# Patient Record
Sex: Female | Born: 1980 | Hispanic: No | Marital: Married | Smoking: Never smoker
Health system: Southern US, Community
[De-identification: ages and names within clinical notes are randomized; demographics above are authoritative.]

## PROBLEM LIST (undated history)

## (undated) ENCOUNTER — Inpatient Hospital Stay (HOSPITAL_COMMUNITY): Payer: Self-pay

## (undated) DIAGNOSIS — R112 Nausea with vomiting, unspecified: Secondary | ICD-10-CM

## (undated) DIAGNOSIS — Z9889 Other specified postprocedural states: Secondary | ICD-10-CM

## (undated) HISTORY — PX: LAPAROSCOPY: SHX197

## (undated) HISTORY — PX: BREAST SURGERY: SHX581

## (undated) HISTORY — PX: POLYPECTOMY: SHX149

---
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
## 1999-07-08 ENCOUNTER — Encounter: Payer: Self-pay | Admitting: *Deleted

## 1999-07-08 ENCOUNTER — Inpatient Hospital Stay (HOSPITAL_COMMUNITY): Admission: AD | Admit: 1999-07-08 | Discharge: 1999-07-08 | Payer: Self-pay | Admitting: *Deleted

## 1999-12-23 ENCOUNTER — Other Ambulatory Visit: Admission: RE | Admit: 1999-12-23 | Discharge: 1999-12-23 | Payer: Self-pay | Admitting: Obstetrics and Gynecology

## 2000-03-31 HISTORY — PX: REDUCTION MAMMAPLASTY: SUR839

## 2000-04-16 ENCOUNTER — Inpatient Hospital Stay (HOSPITAL_COMMUNITY): Admission: AD | Admit: 2000-04-16 | Discharge: 2000-04-16 | Payer: Self-pay | Admitting: Obstetrics and Gynecology

## 2000-04-17 ENCOUNTER — Inpatient Hospital Stay (HOSPITAL_COMMUNITY): Admission: AD | Admit: 2000-04-17 | Discharge: 2000-04-19 | Payer: Self-pay | Admitting: Obstetrics and Gynecology

## 2000-04-17 ENCOUNTER — Encounter: Payer: Self-pay | Admitting: Obstetrics and Gynecology

## 2000-05-12 ENCOUNTER — Inpatient Hospital Stay (HOSPITAL_COMMUNITY): Admission: AD | Admit: 2000-05-12 | Discharge: 2000-05-12 | Payer: Self-pay | Admitting: Obstetrics and Gynecology

## 2000-06-10 ENCOUNTER — Inpatient Hospital Stay (HOSPITAL_COMMUNITY): Admission: AD | Admit: 2000-06-10 | Discharge: 2000-06-10 | Payer: Self-pay | Admitting: Obstetrics and Gynecology

## 2000-06-15 ENCOUNTER — Inpatient Hospital Stay (HOSPITAL_COMMUNITY): Admission: AD | Admit: 2000-06-15 | Discharge: 2000-06-15 | Payer: Self-pay | Admitting: Obstetrics and Gynecology

## 2000-06-23 ENCOUNTER — Inpatient Hospital Stay (HOSPITAL_COMMUNITY): Admission: AD | Admit: 2000-06-23 | Discharge: 2000-06-25 | Payer: Self-pay | Admitting: Obstetrics and Gynecology

## 2000-12-22 ENCOUNTER — Other Ambulatory Visit: Admission: RE | Admit: 2000-12-22 | Discharge: 2000-12-22 | Payer: Self-pay | Admitting: Obstetrics and Gynecology

## 2000-12-24 ENCOUNTER — Encounter: Payer: Self-pay | Admitting: Obstetrics and Gynecology

## 2000-12-24 ENCOUNTER — Encounter: Admission: RE | Admit: 2000-12-24 | Discharge: 2000-12-24 | Payer: Self-pay | Admitting: Obstetrics and Gynecology

## 2001-05-27 ENCOUNTER — Inpatient Hospital Stay (HOSPITAL_COMMUNITY): Admission: AD | Admit: 2001-05-27 | Discharge: 2001-05-27 | Payer: Self-pay | Admitting: *Deleted

## 2001-11-04 ENCOUNTER — Encounter (INDEPENDENT_AMBULATORY_CARE_PROVIDER_SITE_OTHER): Payer: Self-pay | Admitting: Specialist

## 2001-11-04 ENCOUNTER — Ambulatory Visit (HOSPITAL_BASED_OUTPATIENT_CLINIC_OR_DEPARTMENT_OTHER): Admission: RE | Admit: 2001-11-04 | Discharge: 2001-11-05 | Payer: Self-pay | Admitting: Plastic Surgery

## 2002-10-05 ENCOUNTER — Other Ambulatory Visit: Admission: RE | Admit: 2002-10-05 | Discharge: 2002-10-05 | Payer: Self-pay | Admitting: Obstetrics and Gynecology

## 2003-04-05 ENCOUNTER — Inpatient Hospital Stay (HOSPITAL_COMMUNITY): Admission: AD | Admit: 2003-04-05 | Discharge: 2003-04-07 | Payer: Self-pay | Admitting: Obstetrics and Gynecology

## 2005-03-06 ENCOUNTER — Emergency Department (HOSPITAL_COMMUNITY): Admission: EM | Admit: 2005-03-06 | Discharge: 2005-03-06 | Payer: Self-pay | Admitting: Emergency Medicine

## 2008-05-02 ENCOUNTER — Encounter: Admission: RE | Admit: 2008-05-02 | Discharge: 2008-05-02 | Payer: Self-pay | Admitting: Internal Medicine

## 2009-02-16 ENCOUNTER — Ambulatory Visit (HOSPITAL_COMMUNITY): Admission: RE | Admit: 2009-02-16 | Discharge: 2009-02-16 | Payer: Self-pay | Admitting: Obstetrics and Gynecology

## 2010-01-08 ENCOUNTER — Encounter: Admission: RE | Admit: 2010-01-08 | Discharge: 2010-01-08 | Payer: Self-pay | Admitting: Internal Medicine

## 2010-07-03 LAB — CBC
MCHC: 33.6 g/dL (ref 30.0–36.0)
MCV: 78.7 fL (ref 78.0–100.0)
RDW: 14 % (ref 11.5–15.5)

## 2010-07-03 LAB — HCG, SERUM, QUALITATIVE: Preg, Serum: NEGATIVE

## 2010-08-16 NOTE — H&P (Signed)
[facility]  Patient:    Joyce King, Joyce King                     MRN: [id number] Adm. Date:  [id number] Attending:  Dierdre Forth Pearline Dictator:   Vance Gather Duplantis, C.N.M.                         History and Physical  BRIEF HISTORY:  Ms. Joyce King is a 30 year old single native American female gravida 4, para 0-0-3-0 at 37 and 6/7ths weeks who presented for evaluation complaining of nausea, vomiting, diarrhea this morning and reoccurrence of uterine contractions every 4 to 5 minutes that she had had previously had last night.  She was seen in maternity admissions last night for the same uterine contractions and they resolved after two doses of subcutaneous terbutaline.  She went home on bed rest last night and awoke this morning with nausea, vomiting and diarrhea and has persisted with nausea, vomiting, and diarrhea throughout the day making it difficult for her to keep down the ibuprofen that she was given for contractions.  She denies any headaches or visual disturbances.  She denies any vaginal bleeding.  She reports positive fetal movement.  Her pregnancy has been followed at [facility] by the Certified Nurse Midwife Service to date and has been at risk for history of greater than 2 abortions and this episode of preterm uterine contractions.  OB/GYN HISTORY:  She is a gravida 4, para 0-0-3-0 who had an elective abdomen in 1997 followed by a miscarriage in 1999 and a miscarriage in March of 2001, all with no complications.  She has had a laparoscopy in 2000, and was noted to have multiple cysts on her ovaries.  GENERAL MEDICAL HISTORY:  She has no known drug allergies.  She reports having had the usual childhood diseases, and she reports having had chlamydia in 1999 and being treated at that time.  She reports a history of asthma and uses an inhaler occasionally and she had seizures as a baby but none since.  Her family history is  significant for a paternal uncle with heart attack and a maternal grandmother with insulin-dependent diabetes and a great maternal grandmother with breast cancer.  GENETIC HISTORY:  Negative.  SOCIAL HISTORY:  The father of the baby is Breeze Berringer.  He is involved and supportive. They are both employed full time.  They deny any illicit drug use, alcohol or smoking with this pregnancy.  PRENATAL LABORATORY DATA:  Her blood type is A+.  Antibody screen is negative. Sickle cell trait is negative.  Syphilis is nonreactive.  Rubella immune. Hepatitis B surface antigen is negative.  HIV is nonreactive.  GC and chlamydia are both negative as her first OB visit.  Pap smear was within normal limits and a 1 hour glucola was 117 and maternal serum alpha fetoprotein was within normal range.  PHYSICAL EXAMINATION:  VITAL SIGNS:  Vitals signs are stable with a pulse of 122.  HEENT:  Grossly within normal limits.  HEART:  Regular rate and rhythm.  CHEST:  Clear.  BREASTS:  Soft and nontender.  ABDOMEN:  Gravid with uterine contractions every 4 to 5 minutes that failed to respond to IV fluid hydration in maternity admissions.  CERVIX:  Her cervix is closed, 40% effaced.  Vertex is minus 1 and ballotable between contractions with a thin lower uterine segment.  Clean catheterization urinalysis shows ketones greater than 80.  Specific gravity is 1025 otherwise negative.  Other labs are pending including GC, chlamydia, PIH labs and group B streptococcus.  ASSESSMENT: 1. Intrauterine pregnancy at 33 and 6/7ths weeks. 2. Preterm uterine contractions with possible cervical change and nausea,    vomiting and diarrhea.  PLAN:  Per consult with Dr. Janine Limbo is to admit to try tocolysing her with Procardia to get a complete OB ultrasound and to follow up on her labs. DD:  04/17/00 TD:  04/17/00 Job: [id number]

## 2010-08-16 NOTE — H&P (Signed)
[facility]  Patient:    Joyce King, Joyce King                     MRN: [id number] Adm. Date:  [id number] Attending:  Shaune Spittle Dictator:   Vance Gather Duplantis, C.N.M.                         History and Physical                                Joyce King is a 30 year old single black female gravida 4, para 0-0-3-0 who presents at 39 weeks and 1 day with spontaneous rupture of the membranes at 5:30 this morning.  She reports uterine contractions every three minutes of increasing intensity since rupture of the membranes.  She reports that the fluid she has seen is clear.  She denies any nausea, vomiting, headaches, or visual disturbances.  She reports positive fetal movement.  Her pregnancy has been followed at [facility] by the certified nurse midwife service and has been at risk for positive group B strep, multiple ABs, and some preterm uterine contractions with this pregnancy.  PAST OBSTETRICAL HISTORY:     Gravida 4, para 0-0-3-0.  Had an elective AB in 1997, miscarriage in 1999, and another miscarriage in 2001.  PAST GYNECOLOGICAL HISTORY:   She also reports a history of a laparoscopy in 2000 and that she has multiple cysts on both ovaries.  ALLERGIES:                    No known drug allergies.  PAST MEDICAL HISTORY:         Usual childhood diseases.  She reports a history of asthma and has used inhalers in the past.  She has had no problems with asthma with this pregnancy.  She reports a history of seizures as a baby, but none since.  FAMILY HISTORY:               Significant for paternal uncle with heart attack, maternal grandmother with insulin-dependent diabetes, and maternal great grandmother with breast cancer.  GENETIC HISTORY:              Negative.  SOCIAL HISTORY:               She is single.  The father of the baby is Joyce King.  He is involved and supportive.  They are both employed full-time. They  deny any illicit drug use, alcohol, or smoking with this pregnancy.  PRENATAL LABORATORIES:        Blood type A+.  Antibody screen is negative. Sickle cell trait is negative.  Syphilis is nonreactive.  Rubella is immune. Hepatitis B surface antigen is negative.  HIV is nonreactive.  GC and chlamydia are both negative.  Pap is within normal limits.  One hour glucola is within normal limits.  Maternal serum alpha fetoprotein is also within normal limits.  Beta strep was positive.  PHYSICAL EXAMINATION  VITAL SIGNS:                  Stable.  She is afebrile.  HEENT:                        Grossly within normal limits.  HEART:  Regular rate and rhythm.  CHEST:                        Clear.  BREASTS:                      Soft and nontender.  ABDOMEN:                      Gravid with uterine contractions every three to four minutes.  Fetal heart rate is reactive and reassuring.  PELVIC:                       Cervix:  3 cm, 90%, vertex -1 with clear fluid noted.  Positive pooling.  Positive nitrazine.  Positive fern.  EXTREMITIES:                  Within normal limits.  ASSESSMENT:                   1. Intrauterine pregnancy at term.                               2. Spontaneous rupture of the membranes with                                  clear fluid at 5:30 this morning.                               3. Early labor.                               4. Positive group B strep.  PLAN:                         Admit to labor and delivery for a consult with Dr. Leonard Schwartz.  Follow routine C.N.M. orders.  Give her penicillin for group B strep prophylaxis. DD:  06/23/00 TD:  06/23/00 Job: [id number]

## 2010-08-16 NOTE — Op Note (Signed)
Joyce King, Joyce King                        ACCOUNT NO.:  [id number]   MEDICAL RECORD NO.:  [id number]                   PATIENT TYPE:  AT   LOCATION:  [facility]                                 FACILITY:  [facility]   PHYSICIAN:  Mary A. Contogiannis, M.D.          DATE OF BIRTH:  [birth year]   DATE OF PROCEDURE:  11/04/2001  DATE OF DISCHARGE:  05/27/2001                                 OPERATIVE REPORT   PREOPERATIVE DIAGNOSIS:  Bilateral macromastia.   POSTOPERATIVE DIAGNOSIS:  Bilateral macromastia.   PROCEDURE:  Bilateral reduction mammoplasty.   ATTENDING SURGEON:  Mary A. Contogiannis, M.D.   ASSISTANT:  Charles A. Clearance Coots, M.D.   ANESTHESIA:  General endotracheal.   ANESTHESIOLOGIST:  Janetta Hora. Gelene Mink, M.D.   ESTIMATED BLOOD LOSS:  225 cc   FLUID REPLACEMENT:  2200 cc crystalloid.   URINE OUTPUT:  700 cc   COMPLICATIONS:  None.   JUSTIFICATION FOR OVERNIGHT STAY:  Progressive pain control along with  ambulation and monitoring of nipples and breast flaps.   INDICATIONS FOR PROCEDURE:  The patient is a 30 year old African-American  female with bilateral macromastia that is currently symptomatic.  She  complains of backaches, headaches, shoulder strap groove marks and pain.  She presents for bilateral reduction mammoplasty.   AMOUNT OF BREAST TISSUE REMOVED:  Right breast 614 g, left breast 596 g.   PROCEDURE:  The patient was marked in the preop holding area in the pattern  of Wise for the future bilateral reduction mammoplasties.  She was then  taken back to the OR and placed on the table in the supine position.  After  adequate general endotracheal anesthesia was obtained, the patient's chest  was prepped with Betadine and alcohol and draped in a sterile fashion.  The  base of the breast was then injected with 1% lidocaine with epinephrine.  After adequate hemostasis was obtained, the procedure was begun.   First, the right breast reduction was performed.  The  nipple was marked at  42 mm nipple marker.  This was then incised and the skin de-epithelialized,  surrounded down to the inframammary crease and inferior pedicle pattern.  Next, the medial, superior and lateral skin flaps were then elevated down to  the chest wall.  The excess fat and glandular tissue were removed from the  inferior pedicle.  The nipple was then examined and found to be pink and  viable.  The wound was irrigated with saline irrigation.  Meticulous  hemostasis was obtained with the Bovie electrocautery.  The inferior pedicle  was centralized using 3-0 Prolene suture.  A #10 JP flat fully fluted drain  was then placed into the wound.  The skin flaps were brought together in an  inverted T-junction with a 2-0 Prolene suture.  The incision was then  stapled for temporary closure.   Attention was then turned to the left breast.  The left nipple was marked  with the 42  mm nipple marker.  This was then incised and skin de-  epithelialized down to the inframammary crease and inferior pedicle pattern.  Next, medial, superior and lateral skin flaps were elevated down to the  chest wall.  The excess fat and glandular tissue were removed from the  inferior pedicle.  The nipple was then examined and found to be pink and  viable.  The wound was irrigated with saline irrigation.  Meticulous  hemostasis was obtained with the Bovie electrocautery.  The inferior pedicle  was centralized using 3-0 Prolene suture.  A #10 JP flat fully fluted drain  was then placed into the wound.  The skin flaps were brought together in an  inverted T-junction with a 2-0 chromic suture.  The incision was stable for  temporary closure.  The breast appeared to have good shape and symmetry.  All the staples were then removed and the incisions closed using 3-0  Monocryl interrupted as well as running dermal sutures followed by 4-0  Monocryl in an intra-articular stitch on the skin.   The patient was then  placed upright and the future location of the nipples  was marked on each breast mound with the 38 mm nipple marker.  She was then  placed in recumbent position.  First, the future nipple areolar complex on  the right breast was incised.  The suture was excised full-thickness.  The  nipple was then examined and found to be pink and viable and brought out  into this aperture and sewn in place using 4-0 Monocryl interrupted sutures  followed by 5-0 Monocryl in an intracuticular stitch on the skin.  In a  likewise fashion, the future nipple areolar complex was incised on the left  breast.  This tissue was excised full-thickness.  The nipple was then  examined and found to be pink and viable and brought out into this aperture  and sewn in place using 4-0 Monocryl interrupted dermal sutures followed by  a 5-0 Monocryl running intracuticular stitch on the skin.  The JP drain was  sewn in place using 3-0 nylon suture.  The incision was dressed with Benzoin  and Steri-Strips and the nipples additionally with Bacitracin ointment and  Adaptic.  4x4s were then placed over the wound, and the patient was placed  in a light postoperative support bra.  There were no complications.  The  patient tolerated the procedure well.  Final needle and sponge counts were  reported to be correct at the end of the case.  The patient was then  extubated and taken to the recovery room in stable condition.  She will  remain overnight in the RCC and progressive pain control along with  ambulation, monitoring of the nipples and breast flaps.  Discharge planned  for the morning.  Amount of breast tissue removed from right breast 614 g,  left breast 596 g.                                               Mary A. Contogiannis, M.D.    MAC/MEDQ  D:  11/04/2001  T:  11/08/2001  Job:  [id number]   cc:   Leonette Most A. Clearance Coots, M.D.

## 2010-08-16 NOTE — Discharge Summary (Signed)
[facility]  Patient:    Joyce King, Joyce King                     MRN: [id number] Adm. Date:  [id number] Disc. Date: [id number] Attending:  Shaune Spittle Dictator:   Mack Guise, C.N.M.                           Discharge Summary  ADMITTING DIAGNOSES:          1. Intrauterine pregnancy at 29-6/7 weeks with                                  nausea, vomiting, and diarrhea.                               2. Preterm uterine contractions.  DISCHARGE DIAGNOSES:          1. Intrauterine pregnancy at 30-1/7 weeks,                                  nausea and vomiting resolved.                               2. Preterm contractions stable.  HISTORY/HOSPITAL COURSE:      [name] is a 30 year old, gravida 4, para 0-0-3-0 at 29-6/7 weeks who presents with nausea, vomiting, and diarrhea and uterine contractions that are becoming stronger. She was admitted for tocolysis with Procardia and was put on magnesium sulfate. Her nausea and vomiting improved with antiemetics, and on her second day, she had no further nausea and vomiting and was able to keep down food and fluid. She was treated for positive Chlamydia with Zithromax one gram p.o. and was given betamethasone.  On April 19, 2000 magnesium sulfate was turned off. The patient continued to be stable with no further contractions. Her cervix remained unchanged and was found to be closed and 50%. Her fetal heart rate was reactive and reassuring, and she was discharged home on strict bed rest with no tocolysis and to call for any preterm labor symptoms. She will keep her next scheduled appointment at the office of [facility]. DD:  04/19/00 TD:  04/19/00 Job: [id number]

## 2010-08-16 NOTE — H&P (Signed)
NAME:  GABBRIELLA, PRESSWOOD                       ACCOUNT NO.:  [id number]   MEDICAL RECORD NO.:  [id number]                   PATIENT TYPE:  INP   LOCATION:  9165                                 FACILITY:  [facility]   PHYSICIAN:  Renaldo Reel. Emilee Hero, C.N.M.             DATE OF BIRTH:  10-Apr-1980   DATE OF ADMISSION:  04/05/2003  DATE OF DISCHARGE:                                HISTORY & PHYSICAL   There was no dictation for this job.                                               Renaldo Reel Emilee Hero, C.N.M.    VLL/MEDQ  D:  04/05/2003  T:  04/05/2003  Job:  [id number]

## 2010-08-16 NOTE — H&P (Signed)
NAME:  Joyce King, Joyce King                       ACCOUNT NO.:  [id number]   MEDICAL RECORD NO.:  [id number]                   PATIENT TYPE:  INP   LOCATION:  9165                                 [facility]ILITY:  [facility]   PHYSICIAN:  Naima A. Dillard, M.D.              DATE OF BIRTH:  1980-08-13   DATE OF ADMISSION:  04/05/2003  DATE OF DISCHARGE:                                HISTORY & PHYSICAL   HISTORY OF PRESENT ILLNESS:  The patient is a 30 year old, gravida 5, para 1-  0-3-1, at 36-2/7 weeks who presents for admission to [facility] with  early labor.  She had been seen at [facility] office earlier today  with cervix 3 cm, 85 to 90%, vertex at -1 to 0 station with uterine  contractions every seven minutes.  She now presents back to [facility] with contractions every three to five minutes.  Cervix was 4+ cm, 80%,  vertex at -1 to 0 station with slight bulging bag of water.  She is  therefore admitted to [facility] for further labor care.   Pregnancy has been remarkable for:  1) History of positive Group B Strep in  previous pregnancy, but negative this pregnancy.  2) One TAB and two SAB's.   PRENATAL LABORATORY DATA:  Blood type is A positive, Rh antibody negative,  VDRL nonreactive, rubella titer positive, hepatitis B surface antigen  negative, sickle cell test negative, GC and Chlamydia cultures were  negative.  Pap was normal.  Glucose Challenge was normal. Hemoglobin upon  entry into practice was 13.2, it was 11.7 at 26 weeks.  Sickle cell testing  was negative.  HIV was negative.  Quadruple screen was normal.  Group B  Strep culture was negative at 36 weeks.  GC and Chlamydia cultures were  negative at 36 weeks.  EDC of May 01, 2003, was established by  ultrasound at six weeks and was in agreement with ultrasound at 18 weeks.   HISTORY OF PRESENT PREGNANCY:  The patient entered care at approximately 10  weeks.  She was treated for BV at that time.   She was having some difficulty  with nausea and vomiting and was treated with Phenergan.  She had quadruple  screen that was normal.  She had an ultrasound at 18 weeks that showed  normal growth and development.  She got married on December 10, 2002.  She  had some external itching noted at 26 weeks and was treated with BV again  and Mycolog cream.  She required some dental work at 31 weeks and was placed  on antibiotics.  The rest of her pregnancy was essentially uncomplicated.   PAST OBSTETRICAL HISTORY:  In 1997, the patient had a six-week termination  of pregnancy without complications.  In 1999 and 2001, the patient had first  trimester spontaneous miscarriages.  In 2002, she had a vaginal birth of a  female, weight  7 pounds 2 ounces at [redacted] weeks gestation. She was in labor 12  hours.  She had epidural anesthesia.  She did have preterm cervical  effacement and had positive Beta Strep. This pregnancy is with the same  partner.   PAST MEDICAL HISTORY:  She was on Depo-Provera in the past.  She had a  history of ovarian cyst and a diagnostic laparoscopy.  In 1999 she was  treated for Chlamydia.  She reports occasional yeast infections and the  usual childhood illnesses.  She has occasional asthma attacks very rarely.  She does have a history of a breast reduction.   PAST SURGICAL HISTORY:  Her only other hospitalization was for childbirth.  She had seizures as a baby, but none since.   FAMILY HISTORY:  Her maternal great grandmother had breast cancer, maternal  grandmother had insulin-dependent diabetes mellitus.  Genetic history is  unremarkable.   SOCIAL HISTORY:  The patient is married to the father of the baby.  He is  involved and supportive.  His name is [employer].  The patient is [place]  American and Native American in ethnicity.  She has 1-1/2 years of college.  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  The  patient's husband has four years of  college and he is employed in Airline pilot.  She has been followed by the certified nurse midwife service at [facility].  She denies any alcohol, drug, or tobacco use during this  pregnancy.   PHYSICAL EXAMINATION:  VITAL SIGNS:  Stable, the patient is afebrile.  HEENT:  Within normal limits.  LUNGS:  Bilateral breath sounds are clear.  HEART:  Regular rate and rhythm without murmur.  BREASTS:  Soft and nontender.  ABDOMEN:  Fundal height is approximately 36 cm.  Estimated fetal weight is 6  pounds.  Uterine contractions are every three to five minutes of mild to  moderate quality.  PELVIC:  Cervix 4 to 5 cm, 80%, vertex at -1 to 0 station.  Slight bulging  bag of water noted.  Fetal heart rate is in the 150's by Doppler with no  audible decelerations noted.  EXTREMITIES:  Deep tendon reflexes were 2+ without clonus.  There was a  trace edema noted.   IMPRESSION:  1. Intrauterine pregnancy at 36-2/7 weeks.  2. Early labor.  3. Negative Group B Strep.   PLAN:  Admit to birthing suite per consult with Naima A. Normand Sloop, M.D. as  attending physician.  Routine certified nurse midwife orders.     Renaldo Reel Emilee Hero, C.N.M.                   Naima A. Normand Sloop, M.D.    Leeanne Mannan  D:  04/05/2003  T:  04/05/2003  Job:  [id number]

## 2010-12-25 LAB — HEPATITIS B SURFACE ANTIGEN: Hepatitis B Surface Ag: NEGATIVE

## 2010-12-25 LAB — RUBELLA ANTIBODY, IGM: Rubella: IMMUNE

## 2010-12-25 LAB — ABO/RH

## 2010-12-25 LAB — GC/CHLAMYDIA PROBE AMP, GENITAL: Chlamydia: NEGATIVE

## 2011-04-28 ENCOUNTER — Inpatient Hospital Stay (HOSPITAL_COMMUNITY)
Admission: AD | Admit: 2011-04-28 | Discharge: 2011-04-28 | Disposition: A | Discharge status: Home / Self Care | Payer: Self-pay | Source: Ambulatory Visit | Attending: Obstetrics and Gynecology | Admitting: Obstetrics and Gynecology

## 2011-04-28 ENCOUNTER — Encounter (HOSPITAL_COMMUNITY): Payer: Self-pay

## 2011-04-28 DIAGNOSIS — O4700 False labor before 37 completed weeks of gestation, unspecified trimester: Secondary | ICD-10-CM | POA: Insufficient documentation

## 2011-04-28 DIAGNOSIS — O479 False labor, unspecified: Secondary | ICD-10-CM

## 2011-04-28 MED ORDER — TERBUTALINE SULFATE 1 MG/ML IJ SOLN
INTRAMUSCULAR | Status: AC
  Administered 2011-04-28: 0.25 mg via SUBCUTANEOUS
  Filled 2011-04-28: qty 1

## 2011-04-28 MED ORDER — TERBUTALINE SULFATE 1 MG/ML IJ SOLN
0.2500 mg | Freq: Once | INTRAMUSCULAR | Status: AC
  Administered 2011-04-28: 0.25 mg via SUBCUTANEOUS

## 2011-04-28 NOTE — Progress Notes (Signed)
Patient states she was sent from the office to evaluate preterm contractions. Cervix was long and closed, contractions every 2-3 minutes. Denies any leaking or bleeding and reports good fetal movement.

## 2011-04-28 NOTE — ED Provider Notes (Signed)
History     Chief Complaint  Patient presents with  . Contractions   HPI Ms. Engen is a [id number] who presents today at [redacted]w[redacted]d gestation with a chief complaint of contractions. She has a previous preterm birth at 78 weeks.  She was on bedrest for preterm contractions with her first child, but delivered at 39 weeks She states that she had back pain this weekend and that she began noticing regular contractions about 7:30 am.  She denies any leakage of fluids or loss of mucous plug.  She has been drinking plenty of water.  She saw her Physician Dr. Richardean Chimera this morning just before noon. He performed a fetal fibronectin and stated that her cervix was long and closed at the exam.  Arthor Captain PA-S  OB History    Grav Para Term Preterm Abortions TAB SAB Ect Mult Living   5 2 1 1 2 2   0 0 2      Past Medical History  Diagnosis Date  . Asthma     Past Surgical History  Procedure Date  . Laparoscopy   . Breast surgery     reduction  . Polypectomy     Family History  Problem Relation Age of Onset  . Cancer Mother   . Cancer Maternal Grandmother     History  Substance Use Topics  . Smoking status: Never Smoker   . Smokeless tobacco: Not on file  . Alcohol Use: No    Allergies: No Known Allergies  Prescriptions prior to admission  Medication Sig Dispense Refill  . albuterol (PROVENTIL HFA;VENTOLIN HFA) 108 (90 BASE) MCG/ACT inhaler Inhale 2 puffs into the lungs every 6 (six) hours as needed. For shortness of breath/asthma      . Prenatal Vit-Fe Fumarate-FA (PRENATAL MULTIVITAMIN) TABS Take 1 tablet by mouth at bedtime.        Review of Systems  All other systems reviewed and are negative.   Physical Exam   Blood pressure 125/70, pulse 84, temperature 98.4 F (36.9 C), temperature source Oral, resp. rate 16, SpO2 99.00%.  Physical Exam  Constitutional: She is oriented to person, place, and time. She appears well-developed and well-nourished.  HENT:  Head:  Normocephalic and atraumatic.  Cardiovascular: Normal rate and regular rhythm.   Respiratory: Effort normal and breath sounds normal.  GI: Soft. Bowel sounds are normal.  Genitourinary: Vagina normal.       Cervix closed/ long/ high and soft. External os 1 cm  Neurological: She is alert and oriented to person, place, and time.    MAU Course  Procedures Terbutaline 0.25 mg SQ Cervical exam FHR monitoring  MDM Fetal heart rate  140 with Accels, no decels, moderate variability Contractions 3/10 minutes lasting 40-70 seconds upon arrival, 1 contraction in 45 minutes following administration of terbutaline single SQ dose Discuss plan to d/c home with PTL precautions and f/u in office tomorrow with Dr Arelia Sneddon and he agrees.  Assessment and Plan  A: Preterm contractions   P: Discharge home with PTL precautions Follow up with Dr. Arelia Sneddon tomorrow  LEFTWICH-KIRBY, LISA 04/28/2011, 2:11 PM

## 2011-04-28 NOTE — Progress Notes (Signed)
Pt sent over from office for preterm labor eval.  Reports ucs 2-3 minutes in office.   States ucs started around 0730, is feeling an occasional contractions, but not every 2-3 minutes.  Denies any bleeding or lof.  Was closed in office today.  + FM.

## 2011-06-30 LAB — STREP B DNA PROBE: GBS: POSITIVE

## 2011-07-01 ENCOUNTER — Observation Stay (HOSPITAL_COMMUNITY)
Admission: AD | Admit: 2011-07-01 | Discharge: 2011-07-02 | DRG: 379 | Disposition: A | Discharge status: Home / Self Care | Payer: BC Managed Care – PPO | Source: Ambulatory Visit | Attending: Obstetrics and Gynecology | Admitting: Obstetrics and Gynecology

## 2011-07-01 ENCOUNTER — Encounter (HOSPITAL_COMMUNITY): Payer: Self-pay | Admitting: *Deleted

## 2011-07-01 DIAGNOSIS — O479 False labor, unspecified: Secondary | ICD-10-CM

## 2011-07-01 DIAGNOSIS — O4700 False labor before 37 completed weeks of gestation, unspecified trimester: Principal | ICD-10-CM | POA: Diagnosis present

## 2011-07-01 HISTORY — DX: Nausea with vomiting, unspecified: R11.2

## 2011-07-01 HISTORY — DX: Nausea with vomiting, unspecified: Z98.890

## 2011-07-01 LAB — URINALYSIS, ROUTINE W REFLEX MICROSCOPIC
Bilirubin Urine: NEGATIVE
Glucose, UA: NEGATIVE mg/dL
Ketones, ur: 15 mg/dL — AB
Leukocytes, UA: NEGATIVE
Nitrite: NEGATIVE
Protein, ur: NEGATIVE mg/dL

## 2011-07-01 NOTE — MAU Note (Signed)
PT SAYS SHE HAS BEEN HAVING UC SINCE YESTERDAY.  WENT TO DR YESTERDAY- VE 2 CM       DENIES HSV AND MRSA.

## 2011-07-01 NOTE — MAU Provider Note (Signed)
  History     CSN: [id number]  Arrival date and time: 07/01/11 2112   First Provider Initiated Contact with Patient 07/01/11 2208      No chief complaint on file.  HPI 31 y.o. [id number] at [redacted]w[redacted]d with contractions ongoing since yesterday, increased since 6 PM tonight. No vaginal bleeding or LOF. 2-3 cm in office yesterday per Dr. Renaldo Fiddler.  Pregnancy uncomplicated except preterm labor.    Past Medical History  Diagnosis Date  . Asthma     Past Surgical History  Procedure Date  . Laparoscopy   . Breast surgery     reduction  . Polypectomy     Family History  Problem Relation Age of Onset  . Cancer Mother   . Cancer Maternal Grandmother     History  Substance Use Topics  . Smoking status: Never Smoker   . Smokeless tobacco: Not on file  . Alcohol Use: No    Allergies: No Known Allergies  Prescriptions prior to admission  Medication Sig Dispense Refill  . albuterol (PROVENTIL HFA;VENTOLIN HFA) 108 (90 BASE) MCG/ACT inhaler Inhale 2 puffs into the lungs every 6 (six) hours as needed. For shortness of breath/asthma      . Prenatal Vit-Fe Fumarate-FA (PRENATAL MULTIVITAMIN) TABS Take 1 tablet by mouth at bedtime.      . progesterone (PROMETRIUM) 100 MG capsule Place 100 mg vaginally daily.        Review of Systems  Constitutional: Negative.   Respiratory: Negative.   Cardiovascular: Negative.   Gastrointestinal: Negative for nausea, vomiting, abdominal pain, diarrhea and constipation.  Genitourinary: Negative for dysuria, urgency, frequency, hematuria and flank pain.       Negative for vaginal bleeding, vaginal discharge, + contractions   Musculoskeletal: Negative.   Neurological: Negative.   Psychiatric/Behavioral: Negative.    Physical Exam   Blood pressure 103/64, pulse 78, temperature 97.4 F (36.3 C), temperature source Oral, resp. rate 20, height 5\' 5"  (1.651 m), weight 199 lb 2 oz (90.323 kg).  Physical Exam  Nursing note and vitals  reviewed. Constitutional: She is oriented to person, place, and time. She appears well-developed and well-nourished. No distress.  Cardiovascular: Normal rate.   GI: Soft. There is no tenderness.  Genitourinary:       SVE: 3/70/-2 per RN  Musculoskeletal: Normal range of motion.  Neurological: She is alert and oriented to person, place, and time.  Skin: Skin is warm and dry.  Psychiatric: She has a normal mood and affect.   EFM: 140s, mod variability, + accels, UC q2-3 minutes MAU Course  Procedures  Dr. Renaldo Fiddler informed of cervical exam and contractions, plan to observe for 2 hours then recheck cervix.   After 2 hours, little cervical change, 3-4 cm now per RN, still contracting regularly, will observe for 1 more hour then recheck.   On 3rd exam, 4/70/-2, contractions q 2-3 minutes, pt states becoming more uncomfortable.   Assessment and Plan  31 y.o. [id number] at [redacted]w[redacted]d Preterm contractions with small cervical change - admit overnight for observation per Dr. Cyndee Brightly 07/01/2011, 10:13 PM

## 2011-07-01 NOTE — MAU Note (Signed)
SAYS HAS NOT FELT BABY MOVE AS MUCH AS USUAL.  LAST TIME WAS 2 HRS AGO AFTER EATING

## 2011-07-02 ENCOUNTER — Encounter (HOSPITAL_COMMUNITY): Payer: Self-pay | Admitting: *Deleted

## 2011-07-02 LAB — CBC
HCT: 35.9 % — ABNORMAL LOW (ref 36.0–46.0)
Hemoglobin: 12.7 g/dL (ref 12.0–15.0)
MCH: 26.3 pg (ref 26.0–34.0)
MCHC: 35.4 g/dL (ref 30.0–36.0)
RDW: 14.3 % (ref 11.5–15.5)

## 2011-07-02 LAB — RPR: RPR Ser Ql: NONREACTIVE

## 2011-07-02 MED ORDER — BUTORPHANOL TARTRATE 2 MG/ML IJ SOLN
1.0000 mg | INTRAMUSCULAR | Status: DC | PRN
  Administered 2011-07-02: 1 mg via INTRAVENOUS
  Filled 2011-07-02: qty 1

## 2011-07-02 MED ORDER — PHENYLEPHRINE 40 MCG/ML (10ML) SYRINGE FOR IV PUSH (FOR BLOOD PRESSURE SUPPORT): 80.0000 ug | PREFILLED_SYRINGE | INTRAVENOUS | Status: DC | PRN

## 2011-07-02 MED ORDER — OXYTOCIN 20 UNITS IN LACTATED RINGERS INFUSION - SIMPLE: 125.0000 mL/h | Freq: Once | INTRAVENOUS | Status: DC

## 2011-07-02 MED ORDER — PENICILLIN G POTASSIUM 5000000 UNITS IJ SOLR
5.0000 10*6.[IU] | Freq: Once | INTRAVENOUS | Status: AC
  Administered 2011-07-02: 5 10*6.[IU] via INTRAVENOUS
  Filled 2011-07-02: qty 5

## 2011-07-02 MED ORDER — FENTANYL 2.5 MCG/ML BUPIVACAINE 1/10 % EPIDURAL INFUSION (WH - ANES): 14.0000 mL/h | INTRAMUSCULAR | Status: DC

## 2011-07-02 MED ORDER — OXYCODONE-ACETAMINOPHEN 5-325 MG PO TABS: 1.0000 | ORAL_TABLET | ORAL | Status: DC | PRN

## 2011-07-02 MED ORDER — FLEET ENEMA 7-19 GM/118ML RE ENEM: 1.0000 | ENEMA | RECTAL | Status: DC | PRN

## 2011-07-02 MED ORDER — EPHEDRINE 5 MG/ML INJ: 10.0000 mg | INTRAVENOUS | Status: DC | PRN

## 2011-07-02 MED ORDER — PENICILLIN G POTASSIUM 5000000 UNITS IJ SOLR
2.5000 10*6.[IU] | INTRAVENOUS | Status: DC
  Administered 2011-07-02: 2.5 10*6.[IU] via INTRAVENOUS
  Filled 2011-07-02 (×3): qty 2.5

## 2011-07-02 MED ORDER — LACTATED RINGERS IV SOLN: 500.0000 mL | INTRAVENOUS | Status: DC | PRN

## 2011-07-02 MED ORDER — DIPHENHYDRAMINE HCL 50 MG/ML IJ SOLN: 12.5000 mg | INTRAMUSCULAR | Status: DC | PRN

## 2011-07-02 MED ORDER — IBUPROFEN 600 MG PO TABS: 600.0000 mg | ORAL_TABLET | Freq: Four times a day (QID) | ORAL | Status: DC | PRN

## 2011-07-02 MED ORDER — LACTATED RINGERS IV SOLN: 500.0000 mL | Freq: Once | INTRAVENOUS | Status: DC

## 2011-07-02 MED ORDER — LACTATED RINGERS IV SOLN
INTRAVENOUS | Status: DC
  Administered 2011-07-02: 125 mL/h via INTRAVENOUS

## 2011-07-02 MED ORDER — ACETAMINOPHEN 325 MG PO TABS: 650.0000 mg | ORAL_TABLET | ORAL | Status: DC | PRN

## 2011-07-02 MED ORDER — OXYTOCIN BOLUS FROM INFUSION
500.0000 mL | Freq: Once | INTRAVENOUS | Status: DC
  Filled 2011-07-02: qty 500

## 2011-07-02 MED ORDER — ONDANSETRON HCL 4 MG/2ML IJ SOLN: 4.0000 mg | Freq: Four times a day (QID) | INTRAMUSCULAR | Status: DC | PRN

## 2011-07-02 MED ORDER — CITRIC ACID-SODIUM CITRATE 334-500 MG/5ML PO SOLN: 30.0000 mL | ORAL | Status: DC | PRN

## 2011-07-02 MED ORDER — LIDOCAINE HCL (PF) 1 % IJ SOLN: 30.0000 mL | INTRAMUSCULAR | Status: DC | PRN

## 2011-07-02 NOTE — Discharge Summary (Signed)
Obstetric Discharge Summary Reason for Admission: observation/evaluation Prenatal Procedures: none Intrapartum Procedures: false labor Postpartum Procedures: none Complications-Operative and Postpartum: none Hemoglobin  Date Value Range Status  07/02/2011 12.7  12.0-15.0 (g/dL) Final     HCT  Date Value Range Status  07/02/2011 35.9* 36.0-46.0 (%) Final    Physical Exam:  General: cx no change 3-4 witn 50% effacement fhr reactive no decel  Mild irreg ctx'King   Discharge Diagnoses: false labor  Discharge Information: Date: 07/02/2011 Activity: unrestricted Diet: routine Medications: PNV Condition: stable Instructions: refer to practice specific booklet Discharge to: home   Newborn Data  n/a   Joyce King 07/02/2011, 7:55 AM

## 2011-07-02 NOTE — H&P (Signed)
Joyce King is a 31 y.o. female presenting possible labor at 35+ pnc uncomplicated Maternal Medical History:  Contractions: Onset was less than 1 hour ago.   Frequency: irregular.   Perceived severity is mild.    Fetal activity: Perceived fetal activity is normal.    Prenatal Complications - Diabetes: none.    OB History    Grav Para Term Preterm Abortions TAB SAB Ect Mult Living   5 2 1 1 2 2   0 0 2     Past Medical History  Diagnosis Date  . Asthma   . PONV (postoperative nausea and vomiting)    Past Surgical History  Procedure Date  . Laparoscopy   . Breast surgery     reduction  . Polypectomy    Family History: family history includes Cancer in her maternal grandmother and mother. Social History:  reports that she has never smoked. She does not have any smokeless tobacco history on file. She reports that she does not drink alcohol or use illicit drugs.  ROS  Dilation: 3 Effacement (%): 50 Station: -2 Exam by:: Ramsay Bognar Blood pressure 116/60, pulse 67, temperature 97.7 F (36.5 C), temperature source Oral, resp. rate 18, height 5\' 5"  (1.651 m), weight 90.323 kg (199 lb 2 oz). Maternal Exam:  Uterine Assessment: Contraction strength is mild.  Contraction frequency is irregular.   Abdomen: Patient reports no abdominal tenderness. Fundal height is 35 cm.   Estimated fetal weight is 6.    Cervix: Cervix evaluated by digital exam.     Physical Exam  Prenatal labs: ABO, Rh: A/Positive/-- (09/26 0000) Antibody: Negative (09/26 0000) Rubella: Immune (09/26 0000) RPR: Nonreactive (09/26 0000)  HBsAg: Negative (09/26 0000)  HIV: Non-reactive (09/26 0000)  GBS:     Assessment/Plan: IUP at 35 obsever for labor   Rhylen Pulido S 07/02/2011, 7:59 AM

## 2011-07-02 NOTE — Progress Notes (Signed)
Patient ID: Joyce King, female   DOB: 1980/09/17, 31 y.o.   MRN: [id number] Irreg mild ctx's cx 3-4 50 % posterior no change fhr reactive no decels False labor  D/c home

## 2011-07-02 NOTE — Discharge Instructions (Signed)
Preterm Labor Preterm labor is when labor starts at less than 37 weeks of pregnancy. The normal length of a pregnancy is 39 to 41 weeks. CAUSES Often, there is no identifiable underlying cause as to why a woman goes into preterm labor. However, one of the most common known causes of preterm labor is infection. Infections of the uterus, cervix, vagina, amniotic sac, bladder, kidney, or even the lungs (pneumonia) can cause labor to start. Other causes of preterm labor include:  Urogenital infections, such as yeast infections and bacterial vaginosis.   Uterine abnormalities (uterine shape, uterine septum, fibroids, bleeding from the placenta).   A cervix that has been operated on and opens prematurely.   Malformations in the baby.   Multiple gestations (twins, triplets, and so on).   Breakage of the amniotic sac.  Additional risk factors for preterm labor include:  Previous history of preterm labor.   Premature rupture of membranes (PROM).   A placenta that covers the opening of the cervix (placenta previa).   A placenta that separates from the uterus (placenta abruption).   A cervix that is too weak to hold the baby in the uterus (incompetence cervix).   Having too much fluid in the amniotic sac (polyhydramnios).   Taking illegal drugs or smoking while pregnant.   Not gaining enough weight while pregnant.   Women younger than 7 and older than 32 years old.   Low socioeconomic status.   African-American ethnicity.  SYMPTOMS Signs and symptoms of preterm labor include:  Menstrual-like cramps.   Contractions that are 30 to 70 seconds apart, become very regular, closer together, and are more intense and painful.   Contractions that start on the top of the uterus and spread down to the lower abdomen and back.   A sense of increased pelvic pressure or back pain.   A watery or bloody discharge that comes from the vagina.  DIAGNOSIS  A diagnosis can be confirmed by:  A  vaginal exam.   An ultrasound of the cervix.   Sampling (swabbing) cervico-vaginal secretions. These samples can be tested for the presence of fetal fibronectin. This is a protein found in cervical discharge which is associated with preterm labor.   Fetal monitoring.  TREATMENT  Depending on the length of the pregnancy and other circumstances, a caregiver may suggest bed rest. If necessary, there are medicines that can be given to stop contractions and to quicken fetal lung maturity. If labor happens before 34 weeks of pregnancy, a prolonged hospital stay may be recommended. Treatment depends on the condition of both the mother and baby. PREVENTION There are some things a mother can do to lower the risk of preterm labor in future pregnancies. A woman can:   Stop smoking.   Maintain healthy weight gain and avoid chemicals and drugs that are not necessary.   Be watchful for any type of infection.   Inform her caregiver if she has a known history of preterm labor.  Document Released: 06/07/2003 Document Revised: 03/06/2011 Document Reviewed: 07/12/2010 [facility] Patient Information 2012 [place], [place].Fetal Movement Counts Patient Name: ___[name]_______________________________________________ Patient Due Date: ____05/05/2013________________ Kick counts is highly recommended in high risk pregnancies, but it is a good idea for every pregnant woman to do. Start counting fetal movements at 28 weeks of the pregnancy. Fetal movements increase after eating a full meal or eating or drinking something sweet (the blood sugar is higher). It is also important to drink plenty of fluids (well hydrated) before doing the count.  Lie on your left side because it helps with the circulation or you can sit in a comfortable chair with your arms over your belly (abdomen) with no distractions around you. DOING THE COUNT  Try to do the count the same time of day each time you do it.   Mark the day and time,  then see how long it takes for you to feel 10 movements (kicks, flutters, swishes, rolls). You should have at least 10 movements within 2 hours. You will most likely feel 10 movements in much less than 2 hours. If you do not, wait an hour and count again. After a couple of days you will see a pattern.   What you are looking for is a change in the pattern or not enough counts in 2 hours. Is it taking longer in time to reach 10 movements?  SEEK MEDICAL CARE IF:  You feel less than 10 counts in 2 hours. Tried twice.   No movement in one hour.   The pattern is changing or taking longer each day to reach 10 counts in 2 hours.   You feel the baby is not moving as it usually does.  Date: ____________ Movements: ____________ Start time: ____________ Doreatha Martin time: ____________  Date: ____________ Movements: ____________ Start time: ____________ Doreatha Martin time: ____________ Date: ____________ Movements: ____________ Start time: ____________ Doreatha Martin time: ____________ Date: ____________ Movements: ____________ Start time: ____________ Doreatha Martin time: ____________ Date: ____________ Movements: ____________ Start time: ____________ Doreatha Martin time: ____________ Date: ____________ Movements: ____________ Start time: ____________ Doreatha Martin time: ____________ Date: ____________ Movements: ____________ Start time: ____________ Doreatha Martin time: ____________ Date: ____________ Movements: ____________ Start time: ____________ Doreatha Martin time: ____________  Date: ____________ Movements: ____________ Start time: ____________ Doreatha Martin time: ____________ Date: ____________ Movements: ____________ Start time: ____________ Doreatha Martin time: ____________ Date: ____________ Movements: ____________ Start time: ____________ Doreatha Martin time: ____________ Date: ____________ Movements: ____________ Start time: ____________ Doreatha Martin time: ____________ Date: ____________ Movements: ____________ Start time: ____________ Doreatha Martin time: ____________ Date:  ____________ Movements: ____________ Start time: ____________ Doreatha Martin time: ____________ Date: ____________ Movements: ____________ Start time: ____________ Doreatha Martin time: ____________  Date: ____________ Movements: ____________ Start time: ____________ Doreatha Martin time: ____________ Date: ____________ Movements: ____________ Start time: ____________ Doreatha Martin time: ____________ Date: ____________ Movements: ____________ Start time: ____________ Doreatha Martin time: ____________ Date: ____________ Movements: ____________ Start time: ____________ Doreatha Martin time: ____________ Date: ____________ Movements: ____________ Start time: ____________ Doreatha Martin time: ____________ Date: ____________ Movements: ____________ Start time: ____________ Doreatha Martin time: ____________ Date: ____________ Movements: ____________ Start time: ____________ Doreatha Martin time: ____________  Date: ____________ Movements: ____________ Start time: ____________ Doreatha Martin time: ____________ Date: ____________ Movements: ____________ Start time: ____________ Doreatha Martin time: ____________ Date: ____________ Movements: ____________ Start time: ____________ Doreatha Martin time: ____________ Date: ____________ Movements: ____________ Start time: ____________ Doreatha Martin time: ____________ Date: ____________ Movements: ____________ Start time: ____________ Doreatha Martin time: ____________ Date: ____________ Movements: ____________ Start time: ____________ Doreatha Martin time: ____________ Date: ____________ Movements: ____________ Start time: ____________ Doreatha Martin time: ____________  Date: ____________ Movements: ____________ Start time: ____________ Doreatha Martin time: ____________ Date: ____________ Movements: ____________ Start time: ____________ Doreatha Martin time: ____________ Date: ____________ Movements: ____________ Start time: ____________ Doreatha Martin time: ____________ Date: ____________ Movements: ____________ Start time: ____________ Doreatha Martin time: ____________ Date: ____________ Movements: ____________ Start  time: ____________ Doreatha Martin time: ____________ Date: ____________ Movements: ____________ Start time: ____________ Doreatha Martin time: ____________ Date: ____________ Movements: ____________ Start time: ____________ Doreatha Martin time: ____________  Date: ____________ Movements: ____________ Start time: ____________ Doreatha Martin time: ____________ Date: ____________ Movements: ____________ Start time: ____________ Doreatha Martin time: ____________ Date: ____________ Movements: ____________ Start time: ____________ Doreatha Martin time:  ____________ Date: ____________ Movements: ____________ Start time: ____________ Doreatha Martin time: ____________ Date: ____________ Movements: ____________ Start time: ____________ Doreatha Martin time: ____________ Date: ____________ Movements: ____________ Start time: ____________ Doreatha Martin time: ____________ Date: ____________ Movements: ____________ Start time: ____________ Doreatha Martin time: ____________  Date: ____________ Movements: ____________ Start time: ____________ Doreatha Martin time: ____________ Date: ____________ Movements: ____________ Start time: ____________ Doreatha Martin time: ____________ Date: ____________ Movements: ____________ Start time: ____________ Doreatha Martin time: ____________ Date: ____________ Movements: ____________ Start time: ____________ Doreatha Martin time: ____________ Date: ____________ Movements: ____________ Start time: ____________ Doreatha Martin time: ____________ Date: ____________ Movements: ____________ Start time: ____________ Doreatha Martin time: ____________ Date: ____________ Movements: ____________ Start time: ____________ Doreatha Martin time: ____________  Date: ____________ Movements: ____________ Start time: ____________ Doreatha Martin time: ____________ Date: ____________ Movements: ____________ Start time: ____________ Doreatha Martin time: ____________ Date: ____________ Movements: ____________ Start time: ____________ Doreatha Martin time: ____________ Date: ____________ Movements: ____________ Start time: ____________ Doreatha Martin time:  ____________ Date: ____________ Movements: ____________ Start time: ____________ Doreatha Martin time: ____________ Date: ____________ Movements: ____________ Start time: ____________ Doreatha Martin time: ____________ Document Released: 04/16/2006 Document Revised: 03/06/2011 Document Reviewed: 10/17/2008 [employer] Patient Information 2012 [place].

## 2011-07-11 ENCOUNTER — Encounter (HOSPITAL_COMMUNITY): Payer: Self-pay | Admitting: *Deleted

## 2011-07-11 ENCOUNTER — Inpatient Hospital Stay (HOSPITAL_COMMUNITY)
Admission: AD | Admit: 2011-07-11 | Discharge: 2011-07-11 | Disposition: A | Discharge status: Home / Self Care | Payer: BC Managed Care – PPO | Source: Ambulatory Visit | Attending: Obstetrics and Gynecology | Admitting: Obstetrics and Gynecology

## 2011-07-11 DIAGNOSIS — O479 False labor, unspecified: Secondary | ICD-10-CM | POA: Insufficient documentation

## 2011-07-11 NOTE — MAU Note (Signed)
PT SAYS  HAVE UC SINCE - HURT BAD  AT 0000-  ON Monday-  4 CM  IN OFFICE.Marland Kitchen  DENIES HSV AND MRSA.

## 2011-07-11 NOTE — Discharge Instructions (Signed)
Fetal Movement Counts Patient Name: __________________________________________________ Patient Due Date: ____________________ Kick counts is highly recommended in high risk pregnancies, but it is a good idea for every pregnant woman to do. Start counting fetal movements at 28 weeks of the pregnancy. Fetal movements increase after eating a full meal or eating or drinking something sweet (the blood sugar is higher). It is also important to drink plenty of fluids (well hydrated) before doing the count. Lie on your left side because it helps with the circulation or you can sit in a comfortable chair with your arms over your belly (abdomen) with no distractions around you. DOING THE COUNT  Try to do the count the same time of day each time you do it.   Mark the day and time, then see how long it takes for you to feel 10 movements (kicks, flutters, swishes, rolls). You should have at least 10 movements within 2 hours. You will most likely feel 10 movements in much less than 2 hours. If you do not, wait an hour and count again. After a couple of days you will see a pattern.   What you are looking for is a change in the pattern or not enough counts in 2 hours. Is it taking longer in time to reach 10 movements?  SEEK MEDICAL CARE IF:  You feel less than 10 counts in 2 hours. Tried twice.   No movement in one hour.   The pattern is changing or taking longer each day to reach 10 counts in 2 hours.   You feel the baby is not moving as it usually does.  Date: ____________ Movements: ____________ Start time: ____________ Finish time: ____________  Date: ____________ Movements: ____________ Start time: ____________ Finish time: ____________ Date: ____________ Movements: ____________ Start time: ____________ Finish time: ____________ Date: ____________ Movements: ____________ Start time: ____________ Finish time: ____________ Date: ____________ Movements: ____________ Start time: ____________ Finish time:  ____________ Date: ____________ Movements: ____________ Start time: ____________ Finish time: ____________ Date: ____________ Movements: ____________ Start time: ____________ Finish time: ____________ Date: ____________ Movements: ____________ Start time: ____________ Finish time: ____________  Date: ____________ Movements: ____________ Start time: ____________ Finish time: ____________ Date: ____________ Movements: ____________ Start time: ____________ Finish time: ____________ Date: ____________ Movements: ____________ Start time: ____________ Finish time: ____________ Date: ____________ Movements: ____________ Start time: ____________ Finish time: ____________ Date: ____________ Movements: ____________ Start time: ____________ Finish time: ____________ Date: ____________ Movements: ____________ Start time: ____________ Finish time: ____________ Date: ____________ Movements: ____________ Start time: ____________ Finish time: ____________  Date: ____________ Movements: ____________ Start time: ____________ Finish time: ____________ Date: ____________ Movements: ____________ Start time: ____________ Finish time: ____________ Date: ____________ Movements: ____________ Start time: ____________ Finish time: ____________ Date: ____________ Movements: ____________ Start time: ____________ Finish time: ____________ Date: ____________ Movements: ____________ Start time: ____________ Finish time: ____________ Date: ____________ Movements: ____________ Start time: ____________ Finish time: ____________ Date: ____________ Movements: ____________ Start time: ____________ Finish time: ____________  Date: ____________ Movements: ____________ Start time: ____________ Finish time: ____________ Date: ____________ Movements: ____________ Start time: ____________ Finish time: ____________ Date: ____________ Movements: ____________ Start time: ____________ Finish time: ____________ Date: ____________ Movements:  ____________ Start time: ____________ Finish time: ____________ Date: ____________ Movements: ____________ Start time: ____________ Finish time: ____________ Date: ____________ Movements: ____________ Start time: ____________ Finish time: ____________ Date: ____________ Movements: ____________ Start time: ____________ Finish time: ____________  Date: ____________ Movements: ____________ Start time: ____________ Finish time: ____________ Date: ____________ Movements: ____________ Start time: ____________ Finish time: ____________ Date: ____________ Movements: ____________ Start time:   ____________ Finish time: ____________ Date: ____________ Movements: ____________ Start time: ____________ Finish time: ____________ Date: ____________ Movements: ____________ Start time: ____________ Finish time: ____________ Date: ____________ Movements: ____________ Start time: ____________ Finish time: ____________ Date: ____________ Movements: ____________ Start time: ____________ Finish time: ____________  Date: ____________ Movements: ____________ Start time: ____________ Finish time: ____________ Date: ____________ Movements: ____________ Start time: ____________ Finish time: ____________ Date: ____________ Movements: ____________ Start time: ____________ Finish time: ____________ Date: ____________ Movements: ____________ Start time: ____________ Finish time: ____________ Date: ____________ Movements: ____________ Start time: ____________ Finish time: ____________ Date: ____________ Movements: ____________ Start time: ____________ Finish time: ____________ Date: ____________ Movements: ____________ Start time: ____________ Finish time: ____________  Date: ____________ Movements: ____________ Start time: ____________ Finish time: ____________ Date: ____________ Movements: ____________ Start time: ____________ Finish time: ____________ Date: ____________ Movements: ____________ Start time: ____________ Finish  time: ____________ Date: ____________ Movements: ____________ Start time: ____________ Finish time: ____________ Date: ____________ Movements: ____________ Start time: ____________ Finish time: ____________ Date: ____________ Movements: ____________ Start time: ____________ Finish time: ____________ Date: ____________ Movements: ____________ Start time: ____________ Finish time: ____________  Date: ____________ Movements: ____________ Start time: ____________ Finish time: ____________ Date: ____________ Movements: ____________ Start time: ____________ Finish time: ____________ Date: ____________ Movements: ____________ Start time: ____________ Finish time: ____________ Date: ____________ Movements: ____________ Start time: ____________ Finish time: ____________ Date: ____________ Movements: ____________ Start time: ____________ Finish time: ____________ Date: ____________ Movements: ____________ Start time: ____________ Finish time: ____________ Document Released: 04/16/2006 Document Revised: 03/06/2011 Document Reviewed: 10/17/2008 [employer] Patient Information 2012 [employer].Early Elective Birth Early elective birth refers to making a choice to have a baby before the time the baby is due. The length of a pregnancy is 9 months, or 40 weeks starting from the beginning of a woman's last menstrual period (LMP). Most women naturally go into labor around 40 weeks. A "full-term" pregnancy is considered between 37 and [redacted] weeks gestation age. Currently, early elective births can take place sometime after 39 weeks. Most caregivers practice within the guidelines of delivering a baby no later than 42 weeks and no earlier than 39 weeks. There are always exceptions to this time interval, and the risks involved to the mother and baby need to be considered in those cases.  Induction of labor refers to the use of medicines to bring about contractions. "Labor" is when the cervix starts to widen (dilate).  "Active labor" is when there are contractions and the cervix has dilated to at least 4 cm. Often times, the earlier a mother is in her pregnancy, the longer it takes to get induced. When the cervix is ready (dilated and soft), an induction may take less than a day. However, when a cervix is far away from being ready (long, closed, and firm), it may take days in a hospital for labor to start.  Currently, 39 weeks is considered the earliest a caregivershould start the induction process. This is because the longer the baby stays inside the uterus, the lower the risks are to both the baby and mother. However, sometimes there are very good reasons for a pregnancy to be induced before [redacted] weeks gestation. These exceptions are specific to each individual pregnancy and need to be considered on a case-by-case basis. A good reason to induce one pregnancy may not be good a good reason for another pregnancy.  REASONS FOR ELECTIVE BIRTH It may be safer to induce labor before 39 weeks if:   A woman is carrying more than 1 baby. Current standards are to deliver   twin pregnancies at 38 weeks.   A woman is having complications, such as:   High blood pressure caused by pregnancy (preeclampsia).   Bleeding.   Infection.   There are conditions affecting the baby's health, such as:   Intrauterine growth restriction (IUGR), where the baby is not growing well.   Having abnormal fetal heart rate patterns on the monitor (nonreassuring tracing).   Having a lack of fluid that surrounds the baby (oligohydramnios).   Having placental issues.   Fluid that surrounds the baby (amniotic fluid) is leaking.  There are many other safety reasons that a pregnancy may need to be induced early. REASONS AGAINST ELECTIVE BIRTH Sometimes early elective birth is not the best choice. It may not be a good idea if:   An early birth is just more convenient.   You want the baby to be born on a certain date, like a holiday.   You  are more likely to need a cesarean delivery before 39 weeks.  A cesarean delivery can lead to other problems. Problems include infection, bleeding, and not having enough iron in your blood (anemia), which can cause weakness.  Babies born early (34 to 37 weeks premature):  May need special care at the hospital or in a special care nursery.   Are at a greater risk for:   Brain damage.   Feeding problems.   Breathing problems.   Slow physical and mental development.   May need special care in a neonatal intensive care unit (NICU), but this is rare. The length of the baby's stay in the hospital will depend on how quickly he or she progresses to a safe level of care.   Are at a greater risk for:   Infection.   Bleeding inside the brain.   Dying during their first year of life.  REDUCING EARLY ELECTIVE BIRTHS Carrying a baby longer than 42 weeks is not good for the baby or the mother. A full-term pregnancy is best for baby and mother. Anything earlier can be risky for you and your baby. Remember:  An early elective birth may lead to a cesarean delivery. This can lead to other problems for the mother and baby.   An early elective birth can result in developmental problems for your child.   A baby's brain continues to develop while in the uterus.   A baby's body continues to develop. The baby will be better able to breathe and eat when he or she is born near the due date.   A baby who stays in the uterus longer responds better. The baby will also bond better with you.  Document Released: 11/27/2010 Document Revised: 03/06/2011 Document Reviewed: 11/27/2010 [employer] Patient Information 2012 [employer]. 

## 2011-07-19 ENCOUNTER — Encounter (HOSPITAL_COMMUNITY): Payer: Self-pay | Admitting: *Deleted

## 2011-07-19 ENCOUNTER — Inpatient Hospital Stay (HOSPITAL_COMMUNITY)
Admission: AD | Admit: 2011-07-19 | Discharge: 2011-07-21 | DRG: 373 | Disposition: A | Discharge status: Home / Self Care | Payer: BC Managed Care – PPO | Source: Ambulatory Visit | Attending: Obstetrics and Gynecology | Admitting: Obstetrics and Gynecology

## 2011-07-19 ENCOUNTER — Encounter (HOSPITAL_COMMUNITY): Payer: Self-pay | Admitting: Anesthesiology

## 2011-07-19 ENCOUNTER — Inpatient Hospital Stay (HOSPITAL_COMMUNITY): Payer: BC Managed Care – PPO | Admitting: Anesthesiology

## 2011-07-19 DIAGNOSIS — Z22330 Carrier of Group B streptococcus: Secondary | ICD-10-CM

## 2011-07-19 DIAGNOSIS — O99892 Other specified diseases and conditions complicating childbirth: Secondary | ICD-10-CM | POA: Diagnosis present

## 2011-07-19 LAB — CBC
MCHC: 34.8 g/dL (ref 30.0–36.0)
MCV: 74.2 fL — ABNORMAL LOW (ref 78.0–100.0)
Platelets: 295 10*3/uL (ref 150–400)
RDW: 15.1 % (ref 11.5–15.5)
WBC: 12 10*3/uL — ABNORMAL HIGH (ref 4.0–10.5)

## 2011-07-19 MED ORDER — SIMETHICONE 80 MG PO CHEW
80.0000 mg | CHEWABLE_TABLET | ORAL | Status: DC | PRN
  Administered 2011-07-20: 80 mg via ORAL

## 2011-07-19 MED ORDER — ALBUTEROL SULFATE HFA 108 (90 BASE) MCG/ACT IN AERS
2.0000 | INHALATION_SPRAY | Freq: Four times a day (QID) | RESPIRATORY_TRACT | Status: DC | PRN
  Filled 2011-07-19: qty 6.7

## 2011-07-19 MED ORDER — BISACODYL 10 MG RE SUPP
10.0000 mg | Freq: Every day | RECTAL | Status: DC | PRN
  Filled 2011-07-19: qty 1

## 2011-07-19 MED ORDER — BENZOCAINE-MENTHOL 20-0.5 % EX AERO
1.0000 "application " | INHALATION_SPRAY | CUTANEOUS | Status: DC | PRN
  Administered 2011-07-19: 1 via TOPICAL
  Filled 2011-07-19: qty 56

## 2011-07-19 MED ORDER — LIDOCAINE HCL (PF) 1 % IJ SOLN
INTRAMUSCULAR | Status: DC | PRN
  Administered 2011-07-19 (×3): 4 mL

## 2011-07-19 MED ORDER — FLEET ENEMA 7-19 GM/118ML RE ENEM: 1.0000 | ENEMA | Freq: Every day | RECTAL | Status: DC | PRN

## 2011-07-19 MED ORDER — ACETAMINOPHEN 325 MG PO TABS: 650.0000 mg | ORAL_TABLET | ORAL | Status: DC | PRN

## 2011-07-19 MED ORDER — WITCH HAZEL-GLYCERIN EX PADS: 1.0000 "application " | MEDICATED_PAD | CUTANEOUS | Status: DC | PRN

## 2011-07-19 MED ORDER — OXYCODONE-ACETAMINOPHEN 5-325 MG PO TABS: 1.0000 | ORAL_TABLET | ORAL | Status: DC | PRN

## 2011-07-19 MED ORDER — EPHEDRINE 5 MG/ML INJ: 10.0000 mg | INTRAVENOUS | Status: DC | PRN

## 2011-07-19 MED ORDER — IBUPROFEN 600 MG PO TABS: 600.0000 mg | ORAL_TABLET | Freq: Four times a day (QID) | ORAL | Status: DC | PRN

## 2011-07-19 MED ORDER — IBUPROFEN 600 MG PO TABS
600.0000 mg | ORAL_TABLET | Freq: Four times a day (QID) | ORAL | Status: DC
  Administered 2011-07-19 – 2011-07-21 (×6): 600 mg via ORAL
  Filled 2011-07-19 (×6): qty 1

## 2011-07-19 MED ORDER — CITRIC ACID-SODIUM CITRATE 334-500 MG/5ML PO SOLN: 30.0000 mL | ORAL | Status: DC | PRN

## 2011-07-19 MED ORDER — BENZOCAINE-MENTHOL 20-0.5 % EX AERO
INHALATION_SPRAY | CUTANEOUS | Status: AC
  Filled 2011-07-19: qty 56

## 2011-07-19 MED ORDER — ONDANSETRON HCL 4 MG PO TABS: 4.0000 mg | ORAL_TABLET | ORAL | Status: DC | PRN

## 2011-07-19 MED ORDER — LACTATED RINGERS IV SOLN: 500.0000 mL | INTRAVENOUS | Status: DC | PRN

## 2011-07-19 MED ORDER — LANOLIN HYDROUS EX OINT: TOPICAL_OINTMENT | CUTANEOUS | Status: DC | PRN

## 2011-07-19 MED ORDER — PHENYLEPHRINE 40 MCG/ML (10ML) SYRINGE FOR IV PUSH (FOR BLOOD PRESSURE SUPPORT): 80.0000 ug | PREFILLED_SYRINGE | INTRAVENOUS | Status: DC | PRN

## 2011-07-19 MED ORDER — OXYTOCIN 20 UNITS IN LACTATED RINGERS INFUSION - SIMPLE
125.0000 mL/h | Freq: Once | INTRAVENOUS | Status: AC
  Administered 2011-07-19: 999 mL/h via INTRAVENOUS

## 2011-07-19 MED ORDER — LACTATED RINGERS IV SOLN
500.0000 mL | Freq: Once | INTRAVENOUS | Status: AC
  Administered 2011-07-19: 1000 mL via INTRAVENOUS

## 2011-07-19 MED ORDER — ZOLPIDEM TARTRATE 5 MG PO TABS: 5.0000 mg | ORAL_TABLET | Freq: Every evening | ORAL | Status: DC | PRN

## 2011-07-19 MED ORDER — DIPHENHYDRAMINE HCL 25 MG PO CAPS: 25.0000 mg | ORAL_CAPSULE | Freq: Four times a day (QID) | ORAL | Status: DC | PRN

## 2011-07-19 MED ORDER — FENTANYL 2.5 MCG/ML BUPIVACAINE 1/10 % EPIDURAL INFUSION (WH - ANES)
14.0000 mL/h | INTRAMUSCULAR | Status: DC
  Administered 2011-07-19: 14 mL/h via EPIDURAL
  Filled 2011-07-19: qty 60

## 2011-07-19 MED ORDER — SODIUM CHLORIDE 0.9 % IV SOLN
2.0000 g | Freq: Once | INTRAVENOUS | Status: AC
  Administered 2011-07-19 (×2): 2 g via INTRAVENOUS
  Filled 2011-07-19 (×2): qty 2000

## 2011-07-19 MED ORDER — FAMOTIDINE 20 MG PO TABS
40.0000 mg | ORAL_TABLET | Freq: Once | ORAL | Status: AC
  Administered 2011-07-20: 40 mg via ORAL
  Filled 2011-07-19: qty 2

## 2011-07-19 MED ORDER — METOCLOPRAMIDE HCL 10 MG PO TABS
10.0000 mg | ORAL_TABLET | Freq: Once | ORAL | Status: AC
  Administered 2011-07-20: 10 mg via ORAL
  Filled 2011-07-19: qty 1

## 2011-07-19 MED ORDER — FLEET ENEMA 7-19 GM/118ML RE ENEM: 1.0000 | ENEMA | RECTAL | Status: DC | PRN

## 2011-07-19 MED ORDER — DIPHENHYDRAMINE HCL 50 MG/ML IJ SOLN: 12.5000 mg | INTRAMUSCULAR | Status: DC | PRN

## 2011-07-19 MED ORDER — ONDANSETRON HCL 4 MG/2ML IJ SOLN: 4.0000 mg | INTRAMUSCULAR | Status: DC | PRN

## 2011-07-19 MED ORDER — SENNOSIDES-DOCUSATE SODIUM 8.6-50 MG PO TABS
2.0000 | ORAL_TABLET | Freq: Every day | ORAL | Status: DC
  Administered 2011-07-19 – 2011-07-20 (×2): 2 via ORAL

## 2011-07-19 MED ORDER — LACTATED RINGERS IV SOLN
INTRAVENOUS | Status: DC
  Administered 2011-07-20: 08:00:00 via INTRAVENOUS

## 2011-07-19 MED ORDER — ONDANSETRON HCL 4 MG/2ML IJ SOLN: 4.0000 mg | Freq: Four times a day (QID) | INTRAMUSCULAR | Status: DC | PRN

## 2011-07-19 MED ORDER — LACTATED RINGERS IV SOLN
INTRAVENOUS | Status: DC
  Administered 2011-07-19 (×2): via INTRAVENOUS

## 2011-07-19 MED ORDER — OXYCODONE-ACETAMINOPHEN 5-325 MG PO TABS
1.0000 | ORAL_TABLET | ORAL | Status: DC | PRN
  Administered 2011-07-19 – 2011-07-21 (×6): 1 via ORAL
  Filled 2011-07-19 (×6): qty 1

## 2011-07-19 MED ORDER — PRENATAL MULTIVITAMIN CH: 1.0000 | ORAL_TABLET | Freq: Every day | ORAL | Status: DC

## 2011-07-19 MED ORDER — DIBUCAINE 1 % RE OINT
1.0000 "application " | TOPICAL_OINTMENT | RECTAL | Status: DC | PRN
  Filled 2011-07-19: qty 28

## 2011-07-19 MED ORDER — OXYTOCIN BOLUS FROM INFUSION
500.0000 mL | Freq: Once | INTRAVENOUS | Status: DC
  Filled 2011-07-19: qty 500
  Filled 2011-07-19: qty 1000

## 2011-07-19 MED ORDER — LIDOCAINE HCL (PF) 1 % IJ SOLN
30.0000 mL | INTRAMUSCULAR | Status: DC | PRN
  Filled 2011-07-19: qty 30

## 2011-07-19 MED ORDER — TETANUS-DIPHTH-ACELL PERTUSSIS 5-2.5-18.5 LF-MCG/0.5 IM SUSP
0.5000 mL | Freq: Once | INTRAMUSCULAR | Status: DC
  Filled 2011-07-19: qty 0.5

## 2011-07-19 NOTE — Progress Notes (Signed)
Cx 5-6/C/-2/vtx AROM clear UCs q2-3 min FHT reactive

## 2011-07-19 NOTE — Progress Notes (Signed)
Delivery Note  Rapid second stage SVD VMI  apgars 9/9 Weight/art pH pending Placenta 3 vessels,intact EBL 350cc Cx/vagina intact Small ant lacs, periurethral-bilat, repaired Pt/infant stable in LDR

## 2011-07-19 NOTE — H&P (Signed)
Joyce King is a 31 y.o. female presenting for C/O UCs. No ROM/CNS change/epigastric pain. Maternal Medical History:  Reason for admission: Reason for admission: contractions.  Contractions: Onset was 3-5 hours ago.    Fetal activity: Perceived fetal activity is normal.      OB History    Grav Para Term Preterm Abortions TAB SAB Ect Mult Living   5 2 1 1 2 2   0 0 2     Past Medical History  Diagnosis Date  . Asthma   . PONV (postoperative nausea and vomiting)    Past Surgical History  Procedure Date  . Laparoscopy   . Breast surgery     reduction  . Polypectomy    Family History: family history includes Cancer in her maternal grandmother and mother. Social History:  reports that she has never smoked. She does not have any smokeless tobacco history on file. She reports that she does not drink alcohol or use illicit drugs.  Review of Systems  Eyes: Negative for blurred vision.  Gastrointestinal: Negative for abdominal pain.  Neurological: Negative for headaches.    Dilation: 5.5 Effacement (%): 80 Station: -1 Exam by:: K.Wilson,RN Blood pressure 119/67, pulse 71, temperature 99.2 F (37.3 C), temperature source Oral, resp. rate 18, height 5\' 6"  (1.676 m), weight 90.357 kg (199 lb 3.2 oz). Maternal Exam:  Uterine Assessment: Contraction strength is moderate.  Contraction frequency is regular.   Abdomen: Fetal presentation: vertex     Physical Exam  Cardiovascular: Normal rate and regular rhythm.   Respiratory: Effort normal and breath sounds normal.  GI: There is no tenderness.  Neurological: She has normal reflexes.    Prenatal labs: ABO, Rh: A/Positive/-- (09/26 0000) Antibody: Negative (09/26 0000) Rubella: Immune (09/26 0000) RPR: NON REACTIVE (04/03 0128)  HBsAg: Negative (09/26 0000)  HIV: Non-reactive (09/26 0000)  GBS: Positive (04/01 0000)   Assessment/Plan: 31 yo G5P2 at 37 6/7 weeks with SOL. Pos for GBBS, IV antibiotics running Patient  requests PP-BTL.  D/W patient/husband procedure, permanence, failure rate, increased ectopic risk, bleeding/transfusion-HIV/Hep, organ damage, infection, DVT/PE, pneumonia.   All questions answered.   Joyce King,Joyce King 07/19/2011, 12:39 PM

## 2011-07-19 NOTE — Anesthesia Preprocedure Evaluation (Signed)
Anesthesia Evaluation  Patient identified by MRN, date of birth, ID band Patient awake    Reviewed: Allergy & Precautions, H&P , NPO status , Patient's Chart, lab work & pertinent test results, reviewed documented beta blocker date and time   History of Anesthesia Complications (+) PONV  Airway Mallampati: III TM Distance: >3 FB Neck ROM: full    Dental  (+) Teeth Intact   Pulmonary asthma (no recent inhaler use) ,  breath sounds clear to auscultation        Cardiovascular negative cardio ROS  Rhythm:regular Rate:Normal     Neuro/Psych negative neurological ROS  negative psych ROS   GI/Hepatic negative GI ROS, Neg liver ROS,   Endo/Other  negative endocrine ROS  Renal/GU negative Renal ROS  negative genitourinary   Musculoskeletal   Abdominal   Peds  Hematology negative hematology ROS (+)   Anesthesia Other Findings   Reproductive/Obstetrics (+) Pregnancy                           Anesthesia Physical Anesthesia Plan  ASA: II  Anesthesia Plan: Epidural   Post-op Pain Management:    Induction:   Airway Management Planned:   Additional Equipment:   Intra-op Plan:   Post-operative Plan:   Informed Consent: I have reviewed the patients History and Physical, chart, labs and discussed the procedure including the risks, benefits and alternatives for the proposed anesthesia with the patient or authorized representative who has indicated his/her understanding and acceptance.     Plan Discussed with:   Anesthesia Plan Comments:         Anesthesia Quick Evaluation

## 2011-07-19 NOTE — Anesthesia Procedure Notes (Signed)
Epidural Patient location during procedure: OB Start time: 07/19/2011 1:15 PM Reason for block: procedure for pain  Staffing Performed by: anesthesiologist   Preanesthetic Checklist Completed: patient identified, site marked, surgical consent, pre-op evaluation, timeout performed, IV checked, risks and benefits discussed and monitors and equipment checked  Epidural Patient position: sitting Prep: site prepped and draped and DuraPrep Patient monitoring: continuous pulse ox and blood pressure Approach: midline Injection technique: LOR air  Needle:  Needle type: Tuohy  Needle gauge: 17 G Needle length: 9 cm Needle insertion depth: 5 cm cm Catheter type: closed end flexible Catheter size: 19 Gauge Catheter at skin depth: 10 cm Test dose: negative  Assessment Events: blood not aspirated, injection not painful, no injection resistance, negative IV test and no paresthesia  Additional Notes Discussed risk of headache, infection, bleeding, nerve injury and failed or incomplete block.  Patient voices understanding and wishes to proceed.

## 2011-07-19 NOTE — Anesthesia Preprocedure Evaluation (Signed)
Anesthesia Evaluation  Patient identified by MRN, date of birth, ID band Patient awake    Reviewed: Allergy & Precautions, H&P , NPO status , Patient's Chart, lab work & pertinent test results, reviewed documented beta blocker date and time   History of Anesthesia Complications (+) PONV  Airway Mallampati: III TM Distance: >3 FB Neck ROM: full    Dental  (+) Teeth Intact   Pulmonary asthma (no recent inhaler use) ,  breath sounds clear to auscultation        Cardiovascular negative cardio ROS  Rhythm:regular Rate:Normal     Neuro/Psych negative neurological ROS  negative psych ROS   GI/Hepatic negative GI ROS, Neg liver ROS,   Endo/Other  negative endocrine ROS  Renal/GU negative Renal ROS  negative genitourinary   Musculoskeletal   Abdominal   Peds  Hematology negative hematology ROS (+)   Anesthesia Other Findings   Reproductive/Obstetrics (+) Pregnancy (SVD 4/20)                           Anesthesia Physical  Anesthesia Plan  ASA: II  Anesthesia Plan: Epidural   Post-op Pain Management:    Induction:   Airway Management Planned:   Additional Equipment:   Intra-op Plan:   Post-operative Plan:   Informed Consent: I have reviewed the patients History and Physical, chart, labs and discussed the procedure including the risks, benefits and alternatives for the proposed anesthesia with the patient or authorized representative who has indicated his/her understanding and acceptance.     Plan Discussed with: Surgeon and CRNA  Anesthesia Plan Comments:         Anesthesia Quick Evaluation

## 2011-07-19 NOTE — Progress Notes (Signed)
SVD of viable female per Dr Henderson Cloud.

## 2011-07-19 NOTE — Anesthesia Postprocedure Evaluation (Signed)
  Anesthesia Post-op Note  Patient: Joyce King  Procedure(s) Performed: * No procedures listed *  Patient Location: PACU and Mother/Baby  Anesthesia Type: Epidural  Level of Consciousness: awake, alert  and oriented  Airway and Oxygen Therapy: Patient Spontanous Breathing   Post-op Assessment: Patient's Cardiovascular Status Stable and Respiratory Function Stable  Post-op Vital Signs: stable  Complications: No apparent anesthesia complications

## 2011-07-20 ENCOUNTER — Encounter (HOSPITAL_COMMUNITY): Payer: Self-pay | Admitting: Anesthesiology

## 2011-07-20 ENCOUNTER — Encounter (HOSPITAL_COMMUNITY)
Admission: AD | Disposition: A | Discharge status: Home / Self Care | Payer: Self-pay | Source: Ambulatory Visit | Attending: Obstetrics and Gynecology

## 2011-07-20 ENCOUNTER — Inpatient Hospital Stay (HOSPITAL_COMMUNITY): Payer: BC Managed Care – PPO | Admitting: Anesthesiology

## 2011-07-20 HISTORY — PX: TUBAL LIGATION: SHX77

## 2011-07-20 LAB — CBC
Hemoglobin: 11.4 g/dL — ABNORMAL LOW (ref 12.0–15.0)
MCH: 25.5 pg — ABNORMAL LOW (ref 26.0–34.0)
MCHC: 34.3 g/dL (ref 30.0–36.0)
MCV: 74.3 fL — ABNORMAL LOW (ref 78.0–100.0)
Platelets: 261 10*3/uL (ref 150–400)

## 2011-07-20 LAB — RPR: RPR Ser Ql: NONREACTIVE

## 2011-07-20 SURGERY — LIGATION, FALLOPIAN TUBE, POSTPARTUM
Anesthesia: Epidural | Site: Abdomen | Laterality: Bilateral | Wound class: Clean Contaminated

## 2011-07-20 MED ORDER — SODIUM BICARBONATE 8.4 % IV SOLN
INTRAVENOUS | Status: AC
  Filled 2011-07-20: qty 50

## 2011-07-20 MED ORDER — SODIUM BICARBONATE 8.4 % IV SOLN
INTRAVENOUS | Status: DC | PRN
  Administered 2011-07-20: 5 mL via EPIDURAL

## 2011-07-20 MED ORDER — FAMOTIDINE 20 MG PO TABS: 40.0000 mg | ORAL_TABLET | Freq: Once | ORAL | Status: DC

## 2011-07-20 MED ORDER — FENTANYL CITRATE 0.05 MG/ML IJ SOLN
INTRAMUSCULAR | Status: AC
  Filled 2011-07-20: qty 2

## 2011-07-20 MED ORDER — CEFAZOLIN SODIUM 1-5 GM-% IV SOLN
INTRAVENOUS | Status: DC | PRN
  Administered 2011-07-20: 1 g via INTRAVENOUS

## 2011-07-20 MED ORDER — KETOROLAC TROMETHAMINE 30 MG/ML IJ SOLN
INTRAMUSCULAR | Status: DC | PRN
  Administered 2011-07-20: 30 mg via INTRAVENOUS

## 2011-07-20 MED ORDER — PROPOFOL 10 MG/ML IV EMUL
INTRAVENOUS | Status: DC | PRN
  Administered 2011-07-20 (×2): 20 mg via INTRAVENOUS

## 2011-07-20 MED ORDER — MIDAZOLAM HCL 5 MG/5ML IJ SOLN
INTRAMUSCULAR | Status: DC | PRN
  Administered 2011-07-20: 2 mg via INTRAVENOUS

## 2011-07-20 MED ORDER — METOCLOPRAMIDE HCL 10 MG PO TABS: 10.0000 mg | ORAL_TABLET | Freq: Once | ORAL | Status: DC

## 2011-07-20 MED ORDER — BUPIVACAINE HCL (PF) 0.25 % IJ SOLN
INTRAMUSCULAR | Status: AC
  Filled 2011-07-20: qty 30

## 2011-07-20 MED ORDER — METOCLOPRAMIDE HCL 5 MG/ML IJ SOLN: 10.0000 mg | Freq: Once | INTRAMUSCULAR | Status: DC | PRN

## 2011-07-20 MED ORDER — MEPERIDINE HCL 25 MG/ML IJ SOLN: 6.2500 mg | INTRAMUSCULAR | Status: DC | PRN

## 2011-07-20 MED ORDER — CEFAZOLIN SODIUM 1-5 GM-% IV SOLN
INTRAVENOUS | Status: AC
  Filled 2011-07-20: qty 50

## 2011-07-20 MED ORDER — FENTANYL CITRATE 0.05 MG/ML IJ SOLN
INTRAMUSCULAR | Status: DC | PRN
  Administered 2011-07-20 (×2): 50 ug via INTRAVENOUS

## 2011-07-20 MED ORDER — ONDANSETRON HCL 4 MG/2ML IJ SOLN
INTRAMUSCULAR | Status: AC
  Filled 2011-07-20: qty 2

## 2011-07-20 MED ORDER — ONDANSETRON HCL 4 MG/2ML IJ SOLN
INTRAMUSCULAR | Status: DC | PRN
  Administered 2011-07-20: 4 mg via INTRAVENOUS

## 2011-07-20 MED ORDER — LACTATED RINGERS IV SOLN
INTRAVENOUS | Status: DC
  Administered 2011-07-20: 20 mL/h via INTRAVENOUS

## 2011-07-20 MED ORDER — PROPOFOL 10 MG/ML IV EMUL
INTRAVENOUS | Status: AC
  Filled 2011-07-20: qty 20

## 2011-07-20 MED ORDER — KETOROLAC TROMETHAMINE 30 MG/ML IJ SOLN
INTRAMUSCULAR | Status: AC
  Filled 2011-07-20: qty 1

## 2011-07-20 MED ORDER — SODIUM CHLORIDE 0.9 % IR SOLN
Status: DC | PRN
  Administered 2011-07-20: 1000 mL

## 2011-07-20 MED ORDER — MIDAZOLAM HCL 2 MG/2ML IJ SOLN
INTRAMUSCULAR | Status: AC
  Filled 2011-07-20: qty 2

## 2011-07-20 MED ORDER — BUPIVACAINE HCL (PF) 0.25 % IJ SOLN
INTRAMUSCULAR | Status: DC | PRN
  Administered 2011-07-20: 10 mL

## 2011-07-20 MED ORDER — LIDOCAINE-EPINEPHRINE (PF) 2 %-1:200000 IJ SOLN
INTRAMUSCULAR | Status: AC
  Filled 2011-07-20: qty 20

## 2011-07-20 MED ORDER — LIDOCAINE HCL (CARDIAC) 20 MG/ML IV SOLN
INTRAVENOUS | Status: DC | PRN
  Administered 2011-07-20: 50 mg via INTRAVENOUS

## 2011-07-20 MED ORDER — FENTANYL CITRATE 0.05 MG/ML IJ SOLN: 25.0000 ug | INTRAMUSCULAR | Status: DC | PRN

## 2011-07-20 MED ORDER — LIDOCAINE HCL (CARDIAC) 20 MG/ML IV SOLN
INTRAVENOUS | Status: AC
  Filled 2011-07-20: qty 5

## 2011-07-20 SURGICAL SUPPLY — 24 items
BENZOIN TINCTURE PRP APPL 2/3 (GAUZE/BANDAGES/DRESSINGS) IMPLANT
CLOTH BEACON ORANGE TIMEOUT ST (SAFETY) ×2 IMPLANT
CONTAINER PREFILL 10% NBF 15ML (MISCELLANEOUS) ×4 IMPLANT
DERMABOND ADHESIVE PROPEN (GAUZE/BANDAGES/DRESSINGS) ×1
DERMABOND ADVANCED (GAUZE/BANDAGES/DRESSINGS) ×1
DERMABOND ADVANCED .7 DNX12 (GAUZE/BANDAGES/DRESSINGS) ×1 IMPLANT
DERMABOND ADVANCED .7 DNX6 (GAUZE/BANDAGES/DRESSINGS) ×1 IMPLANT
ELECT REM PT RETURN 9FT ADLT (ELECTROSURGICAL) ×2
ELECTRODE REM PT RTRN 9FT ADLT (ELECTROSURGICAL) ×1 IMPLANT
GLOVE BIO SURGEON STRL SZ8 (GLOVE) ×4 IMPLANT
GOWN PREVENTION PLUS LG XLONG (DISPOSABLE) ×4 IMPLANT
NEEDLE HYPO 25X1 1.5 SAFETY (NEEDLE) IMPLANT
NS IRRIG 1000ML POUR BTL (IV SOLUTION) ×2 IMPLANT
PACK ABDOMINAL MINOR (CUSTOM PROCEDURE TRAY) ×2 IMPLANT
PENCIL BUTTON HOLSTER BLD 10FT (ELECTRODE) ×2 IMPLANT
SPONGE LAP 4X18 X RAY DECT (DISPOSABLE) ×2 IMPLANT
STRIP CLOSURE SKIN 1/4X3 (GAUZE/BANDAGES/DRESSINGS) IMPLANT
SUT PLAIN 0 NONE (SUTURE) ×2 IMPLANT
SUT VICRYL 0 UR6 27IN ABS (SUTURE) ×4 IMPLANT
SUT VICRYL 4-0 PS2 18IN ABS (SUTURE) ×2 IMPLANT
SYR CONTROL 10ML LL (SYRINGE) ×4 IMPLANT
TOWEL OR 17X24 6PK STRL BLUE (TOWEL DISPOSABLE) ×4 IMPLANT
TRAY FOLEY CATH 14FR (SET/KITS/TRAYS/PACK) ×2 IMPLANT
WATER STERILE IRR 1000ML POUR (IV SOLUTION) IMPLANT

## 2011-07-20 NOTE — Anesthesia Postprocedure Evaluation (Signed)
  Anesthesia Post-op Note  Patient: Joyce King  Procedure(s) Performed: Procedure(s) (LRB): POST PARTUM TUBAL LIGATION (Bilateral)  Patient Location: PACU  Anesthesia Type: Epidural  Level of Consciousness: awake, alert  and oriented  Airway and Oxygen Therapy: Patient Spontanous Breathing  Post-op Pain: none  Post-op Assessment: Post-op Vital signs reviewed, Patient's Cardiovascular Status Stable, Respiratory Function Stable, Patent Airway, No signs of Nausea or vomiting, Pain level controlled, No headache and No backache  Post-op Vital Signs: Reviewed and stable  Complications: No apparent anesthesia complications

## 2011-07-20 NOTE — Progress Notes (Signed)
Prior to procedure, I reviewed with patient procedure, risks, permanence.  All questions answered.  Blood pressure 91/48, pulse 88, temperature 97.7 F (36.5 C), temperature source Oral, resp. rate 18, height 5\' 6"  (1.676 m), weight 90.357 kg (199 lb 3.2 oz), SpO2 97.00%, unknown if currently breastfeeding.  Results for orders placed during the hospital encounter of 07/19/11 (from the past 24 hour(s))  CBC     Status: Abnormal   Collection Time   07/19/11 12:10 PM      Component Value Range   WBC 12.0 (*) 4.0 - 10.5 (K/uL)   RBC 5.00  3.87 - 5.11 (MIL/uL)   Hemoglobin 12.9  12.0 - 15.0 (g/dL)   HCT 16.1  09.6 - 04.5 (%)   MCV 74.2 (*) 78.0 - 100.0 (fL)   MCH 25.8 (*) 26.0 - 34.0 (pg)   MCHC 34.8  30.0 - 36.0 (g/dL)   RDW 40.9  81.1 - 91.4 (%)   Platelets 295  150 - 400 (K/uL)  CBC     Status: Abnormal   Collection Time   07/20/11  5:51 AM      Component Value Range   WBC 13.0 (*) 4.0 - 10.5 (K/uL)   RBC 4.47  3.87 - 5.11 (MIL/uL)   Hemoglobin 11.4 (*) 12.0 - 15.0 (g/dL)   HCT 78.2 (*) 95.6 - 46.0 (%)   MCV 74.3 (*) 78.0 - 100.0 (fL)   MCH 25.5 (*) 26.0 - 34.0 (pg)   MCHC 34.3  30.0 - 36.0 (g/dL)   RDW 21.3  08.6 - 57.8 (%)   Platelets 261  150 - 400 (K/uL)

## 2011-07-20 NOTE — Brief Op Note (Signed)
07/19/2011 - 07/20/2011  8:17 AM  PATIENT:  Joyce King  31 y.o. female  PRE-OPERATIVE DIAGNOSIS:  desires sterilization  POST-OPERATIVE DIAGNOSIS:  desires sterilization  PROCEDURE:  Procedure(s) (LRB): POST PARTUM TUBAL LIGATION (Bilateral)  SURGEON:  Surgeon(s) and Role:    * Leslie Andrea, MD - Primary  PHYSICIAN ASSISTANT:   ASSISTANTS: none   ANESTHESIA:   epidural  EBL:  Total I/O In: 700 [I.V.:700] Out: 305 [Urine:300; Blood:5]  BLOOD ADMINISTERED:none  DRAINS: none   LOCAL MEDICATIONS USED:  MARCAINE     SPECIMEN:  Source of Specimen:  bilateral follopian tube segments  DISPOSITION OF SPECIMEN:  PATHOLOGY  COUNTS:  YES  TOURNIQUET:  * No tourniquets in log *  DICTATION: .Other Dictation: Dictation Number [id number]  PLAN OF CARE: PACU, then transfer back to post partum floor  PATIENT DISPOSITION:  PACU - hemodynamically stable.   Delay start of Pharmacological VTE agent (>24hrs) due to surgical blood loss or risk of bleeding: not applicable

## 2011-07-20 NOTE — Preoperative (Signed)
Beta Blockers   Reason not to administer Beta Blockers:Not Applicable 

## 2011-07-20 NOTE — Op Note (Signed)
Joyce King, Joyce King             ACCOUNT NO.:  [id number]  MEDICAL RECORD NO.:  [id number]  LOCATION:  [facility]                          FACILITY:  [facility]  PHYSICIAN:  Guy Sandifer. Henderson Cloud, M.D. DATE OF BIRTH:  [birth year]  DATE OF PROCEDURE:  07/20/2011 DATE OF DISCHARGE:                              OPERATIVE REPORT   PREOPERATIVE DIAGNOSIS:  Multiparity, desires permanent sterilization.  POSTOPERATIVE DIAGNOSIS:  Multiparity, desires permanent sterilization.  PROCEDURE:  Postpartum bilateral tubal ligation.  SURGEON:  Guy Sandifer. Henderson Cloud, MD  ANESTHESIA:  Epidural, Dana Allan, MD  ESTIMATED BLOOD LOSS:  Drops.  SPECIMENS:  Bilateral fallopian tube segments to Pathology.  INDICATIONS AND CONSENT:  This patient is a 31 year old G5, P3, status post vaginal delivery yesterday.  Baby remains doing quite well.  The patient desires permanent sterilization.  Discussed with the patient and husband bilateral tubal ligation.  Reviewed alternative contraception. Emphasized permanent to the procedure, failure rate, and increased ectopic risk.  Reviewed potential risks and complications including but not limited to infection, organ damage, bleeding requiring transfusion of blood products with HIV and hepatitis acquisition, DVT, PE, and pneumonia.  All questions are answered and consent is signed on the chart.  PROCEDURE:  The patient was brought to the operating suite where she was identified.  Epidural anesthetic was augmented with surgical level and she was placed in the dorsal supine position.  A time-out was undertaken.  She was prepped and draped in a sterile fashions.  After testing for adequate epidural anesthesia, the infraumbilical area was infiltrated with approximately 4 mL of 0.5% plain Marcaine.  A small infraumbilical incision was made.  Decision was carried in layers to the peritoneum which was incised.  The left fallopian tube was identified from the cornua to fimbria.  It was  grasped in its mid ampullary portion and a knuckle of fallopian tube was doubly ligation with 2 free ties of plain suture.  This knuckle of tube was then sharply resection.  Cautery was used to assure hemostasis.  Approximately 3 mL of 0.5% plain Marcaine was then dripped onto the tube.  Similar procedure was then carried on the right side.  Again, good hemostasis was noted and the remaining 3 mL of Marcaine was dripped onto the right tube.  The incision was closed in a running fashion with a 0 Vicryl suture.  Skin was closed with subcuticular 4-0 Vicryl suture. Dressings were applied.  All counts were correct.  The patient was taken to the recovery room in stable condition.     Guy Sandifer Henderson Cloud, M.D.     JET/MEDQ  D:  07/20/2011  T:  07/20/2011  Job:  [id number]

## 2011-07-20 NOTE — Transfer of Care (Signed)
Immediate Anesthesia Transfer of Care Note  Patient: Joyce King  Procedure(s) Performed: Procedure(s) (LRB): POST PARTUM TUBAL LIGATION (Bilateral)  Patient Location: PACU  Anesthesia Type: Epidural  Level of Consciousness: awake, alert  and oriented  Airway & Oxygen Therapy: Patient Spontanous Breathing  Post-op Assessment: Report given to PACU RN  Post vital signs: Reviewed  Complications: No apparent anesthesia complications

## 2011-07-21 ENCOUNTER — Encounter (HOSPITAL_COMMUNITY): Payer: Self-pay | Admitting: Obstetrics and Gynecology

## 2011-07-21 MED ORDER — IBUPROFEN 600 MG PO TABS: 600.0000 mg | ORAL_TABLET | Freq: Four times a day (QID) | ORAL | Status: AC

## 2011-07-21 MED ORDER — OXYCODONE-ACETAMINOPHEN 5-325 MG PO TABS: 1.0000 | ORAL_TABLET | ORAL | Status: AC | PRN

## 2011-07-21 NOTE — Progress Notes (Signed)
Post Partum Day 2 Subjective: no complaints  Objective: Blood pressure 99/65, pulse 62, temperature 97.5 F (36.4 C), temperature source Oral, resp. rate 18, height 5\' 6"  (1.676 m), weight 90.357 kg (199 lb 3.2 oz), SpO2 100.00%, unknown if currently breastfeeding.  Physical Exam:  General: alert and cooperative Lochia: appropriate Uterine Fundus: firm Incision: n/a DVT Evaluation: No evidence of DVT seen on physical exam.   Basename 07/20/11 0551 07/19/11 1210  HGB 11.4* 12.9  HCT 33.2* 37.1    Assessment/Plan: Discharge home   LOS: 2 days   Lisbeth Puller 07/21/2011, 8:40 AM

## 2011-07-21 NOTE — Discharge Summary (Signed)
Obstetric Discharge Summary Reason for Admission: onset of labor Prenatal Procedures: ultrasound Intrapartum Procedures: spontaneous vaginal delivery Postpartum Procedures: P.P. tubal ligation Complications-Operative and Postpartum: anterior and periurethral laceration repair Hemoglobin  Date Value Range Status  07/20/2011 11.4* 12.0-15.0 (g/dL) Final     HCT  Date Value Range Status  07/20/2011 33.2* 36.0-46.0 (%) Final    Physical Exam:  General: alert and cooperative Lochia: appropriate Uterine Fundus: firm Incision: n/a DVT Evaluation: No evidence of DVT seen on physical exam.  Discharge Diagnoses: Term Pregnancy-delivered  Discharge Information: Date: 07/21/2011 Activity: pelvic rest Diet: routine Medications: PNV, Ibuprofen and Percocet Condition: stable Instructions: refer to practice specific booklet Discharge to: home Follow-up Information    Schedule an appointment as soon as possible for a visit in 4 weeks to follow up.         Newborn Data: Live born female  Birth Weight: 7 lb 11.5 oz (3501 g) APGAR: 9, 9  Home with mother.  Keron Neenan 07/21/2011, 8:43 AM

## 2011-07-21 NOTE — Anesthesia Postprocedure Evaluation (Signed)
  Anesthesia Post-op Note  Patient: Joyce King  Procedure(s) Performed: Procedure(s) (LRB): POST PARTUM TUBAL LIGATION (Bilateral)  Patient Location: Mother/Baby  Anesthesia Type: Epidural  Level of Consciousness: awake  Airway and Oxygen Therapy: Patient Spontanous Breathing  Post-op Pain: mild  Post-op Assessment: Patient's Cardiovascular Status Stable and Respiratory Function Stable  Post-op Vital Signs: stable  Complications: No apparent anesthesia complications

## 2011-07-21 NOTE — Addendum Note (Signed)
Addendum  created 07/21/11 0803 by Renford Dills, CRNA   Modules edited:Notes Section

## 2013-01-21 ENCOUNTER — Other Ambulatory Visit (HOSPITAL_COMMUNITY): Payer: Self-pay | Admitting: Obstetrics and Gynecology

## 2013-01-21 DIAGNOSIS — R1031 Right lower quadrant pain: Secondary | ICD-10-CM

## 2013-01-26 ENCOUNTER — Ambulatory Visit (HOSPITAL_COMMUNITY): Admission: RE | Admit: 2013-01-26 | Payer: BC Managed Care – PPO | Source: Ambulatory Visit

## 2013-02-03 ENCOUNTER — Ambulatory Visit (HOSPITAL_COMMUNITY)
Admission: RE | Admit: 2013-02-03 | Discharge: 2013-02-03 | Disposition: A | Discharge status: Home / Self Care | Payer: BC Managed Care – PPO | Source: Ambulatory Visit | Attending: Obstetrics and Gynecology | Admitting: Obstetrics and Gynecology

## 2013-02-03 DIAGNOSIS — R1031 Right lower quadrant pain: Secondary | ICD-10-CM | POA: Insufficient documentation

## 2013-02-03 DIAGNOSIS — N831 Corpus luteum cyst of ovary, unspecified side: Secondary | ICD-10-CM | POA: Insufficient documentation

## 2013-02-03 MED ORDER — IOHEXOL 300 MG/ML  SOLN
100.0000 mL | Freq: Once | INTRAMUSCULAR | Status: AC | PRN
  Administered 2013-02-03: 100 mL via INTRAVENOUS

## 2014-01-30 ENCOUNTER — Encounter (HOSPITAL_COMMUNITY): Payer: Self-pay | Admitting: Obstetrics and Gynecology

## 2014-09-25 ENCOUNTER — Other Ambulatory Visit: Payer: Self-pay

## 2014-09-25 DIAGNOSIS — Z1231 Encounter for screening mammogram for malignant neoplasm of breast: Secondary | ICD-10-CM

## 2014-10-05 ENCOUNTER — Other Ambulatory Visit: Payer: Self-pay | Admitting: Internal Medicine

## 2014-10-05 ENCOUNTER — Ambulatory Visit
Admission: RE | Admit: 2014-10-05 | Discharge: 2014-10-05 | Disposition: A | Discharge status: Home / Self Care | Payer: BLUE CROSS/BLUE SHIELD | Source: Ambulatory Visit | Attending: Internal Medicine | Admitting: Internal Medicine

## 2014-10-05 DIAGNOSIS — R0789 Other chest pain: Secondary | ICD-10-CM

## 2014-10-26 ENCOUNTER — Ambulatory Visit: Payer: BC Managed Care – PPO

## 2014-11-02 ENCOUNTER — Ambulatory Visit
Admission: RE | Admit: 2014-11-02 | Discharge: 2014-11-02 | Disposition: A | Discharge status: Home / Self Care | Payer: BLUE CROSS/BLUE SHIELD | Source: Ambulatory Visit

## 2014-11-02 DIAGNOSIS — Z1231 Encounter for screening mammogram for malignant neoplasm of breast: Secondary | ICD-10-CM

## 2014-11-03 ENCOUNTER — Other Ambulatory Visit: Payer: Self-pay | Admitting: Internal Medicine

## 2014-11-03 ENCOUNTER — Ambulatory Visit
Admission: RE | Admit: 2014-11-03 | Discharge: 2014-11-03 | Disposition: A | Discharge status: Home / Self Care | Payer: BLUE CROSS/BLUE SHIELD | Source: Ambulatory Visit | Attending: Internal Medicine | Admitting: Internal Medicine

## 2014-11-03 DIAGNOSIS — R0789 Other chest pain: Secondary | ICD-10-CM

## 2015-07-26 DIAGNOSIS — E663 Overweight: Secondary | ICD-10-CM | POA: Diagnosis not present

## 2015-07-26 DIAGNOSIS — H6980 Other specified disorders of Eustachian tube, unspecified ear: Secondary | ICD-10-CM | POA: Diagnosis not present

## 2016-04-28 DIAGNOSIS — R0600 Dyspnea, unspecified: Secondary | ICD-10-CM | POA: Diagnosis not present

## 2016-04-28 DIAGNOSIS — B349 Viral infection, unspecified: Secondary | ICD-10-CM | POA: Diagnosis not present

## 2016-12-29 DIAGNOSIS — J029 Acute pharyngitis, unspecified: Secondary | ICD-10-CM | POA: Diagnosis not present

## 2016-12-29 DIAGNOSIS — J069 Acute upper respiratory infection, unspecified: Secondary | ICD-10-CM | POA: Diagnosis not present

## 2017-01-23 DIAGNOSIS — Z01419 Encounter for gynecological examination (general) (routine) without abnormal findings: Secondary | ICD-10-CM | POA: Diagnosis not present

## 2017-01-23 DIAGNOSIS — Z803 Family history of malignant neoplasm of breast: Secondary | ICD-10-CM | POA: Diagnosis not present

## 2017-01-23 DIAGNOSIS — Z3202 Encounter for pregnancy test, result negative: Secondary | ICD-10-CM | POA: Diagnosis not present

## 2017-01-23 DIAGNOSIS — R102 Pelvic and perineal pain: Secondary | ICD-10-CM | POA: Diagnosis not present

## 2017-01-23 DIAGNOSIS — Z6827 Body mass index (BMI) 27.0-27.9, adult: Secondary | ICD-10-CM | POA: Diagnosis not present

## 2017-01-23 DIAGNOSIS — Z01411 Encounter for gynecological examination (general) (routine) with abnormal findings: Secondary | ICD-10-CM | POA: Diagnosis not present

## 2017-01-23 DIAGNOSIS — N83209 Unspecified ovarian cyst, unspecified side: Secondary | ICD-10-CM | POA: Diagnosis not present

## 2017-01-23 DIAGNOSIS — Z8049 Family history of malignant neoplasm of other genital organs: Secondary | ICD-10-CM | POA: Diagnosis not present

## 2017-01-23 DIAGNOSIS — Z801 Family history of malignant neoplasm of trachea, bronchus and lung: Secondary | ICD-10-CM | POA: Diagnosis not present

## 2017-01-30 DIAGNOSIS — N941 Unspecified dyspareunia: Secondary | ICD-10-CM | POA: Diagnosis not present

## 2017-01-30 DIAGNOSIS — R102 Pelvic and perineal pain: Secondary | ICD-10-CM | POA: Diagnosis not present

## 2017-03-18 ENCOUNTER — Other Ambulatory Visit: Payer: Self-pay | Admitting: Internal Medicine

## 2017-03-18 DIAGNOSIS — K5900 Constipation, unspecified: Secondary | ICD-10-CM | POA: Diagnosis not present

## 2017-03-18 DIAGNOSIS — R109 Unspecified abdominal pain: Secondary | ICD-10-CM | POA: Diagnosis not present

## 2017-03-23 ENCOUNTER — Ambulatory Visit
Admission: RE | Admit: 2017-03-23 | Discharge: 2017-03-23 | Disposition: A | Discharge status: Home / Self Care | Payer: BLUE CROSS/BLUE SHIELD | Source: Ambulatory Visit | Attending: Internal Medicine | Admitting: Internal Medicine

## 2017-03-23 DIAGNOSIS — R109 Unspecified abdominal pain: Secondary | ICD-10-CM

## 2017-03-23 DIAGNOSIS — K429 Umbilical hernia without obstruction or gangrene: Secondary | ICD-10-CM | POA: Diagnosis not present

## 2017-03-23 MED ORDER — IOPAMIDOL (ISOVUE-300) INJECTION 61%
100.0000 mL | Freq: Once | INTRAVENOUS | Status: AC | PRN
Start: 2017-03-23 — End: 2017-03-23
  Administered 2017-03-23: 100 mL via INTRAVENOUS

## 2017-03-25 DIAGNOSIS — R109 Unspecified abdominal pain: Secondary | ICD-10-CM | POA: Diagnosis not present

## 2017-03-25 DIAGNOSIS — K5900 Constipation, unspecified: Secondary | ICD-10-CM | POA: Diagnosis not present

## 2017-04-17 DIAGNOSIS — J029 Acute pharyngitis, unspecified: Secondary | ICD-10-CM | POA: Diagnosis not present

## 2017-04-17 DIAGNOSIS — J069 Acute upper respiratory infection, unspecified: Secondary | ICD-10-CM | POA: Diagnosis not present

## 2017-08-26 ENCOUNTER — Ambulatory Visit
Admission: RE | Admit: 2017-08-26 | Discharge: 2017-08-26 | Disposition: A | Discharge status: Home / Self Care | Payer: BLUE CROSS/BLUE SHIELD | Source: Ambulatory Visit | Attending: Internal Medicine | Admitting: Internal Medicine

## 2017-08-26 ENCOUNTER — Other Ambulatory Visit: Payer: Self-pay | Admitting: Internal Medicine

## 2017-08-26 DIAGNOSIS — R102 Pelvic and perineal pain: Secondary | ICD-10-CM | POA: Diagnosis not present

## 2017-08-26 DIAGNOSIS — R1031 Right lower quadrant pain: Secondary | ICD-10-CM

## 2017-08-26 MED ORDER — IOPAMIDOL (ISOVUE-300) INJECTION 61%
100.0000 mL | Freq: Once | INTRAVENOUS | Status: AC | PRN
  Administered 2017-08-26: 100 mL via INTRAVENOUS

## 2017-08-27 ENCOUNTER — Other Ambulatory Visit: Payer: Self-pay | Admitting: Internal Medicine

## 2017-08-27 DIAGNOSIS — R1031 Right lower quadrant pain: Secondary | ICD-10-CM

## 2017-10-15 DIAGNOSIS — J019 Acute sinusitis, unspecified: Secondary | ICD-10-CM | POA: Diagnosis not present

## 2018-02-24 DIAGNOSIS — R55 Syncope and collapse: Secondary | ICD-10-CM | POA: Diagnosis not present

## 2018-02-24 DIAGNOSIS — R1011 Right upper quadrant pain: Secondary | ICD-10-CM | POA: Diagnosis not present

## 2018-02-24 DIAGNOSIS — R630 Anorexia: Secondary | ICD-10-CM | POA: Diagnosis not present

## 2018-08-07 ENCOUNTER — Encounter (HOSPITAL_COMMUNITY): Payer: Self-pay | Admitting: Emergency Medicine

## 2018-08-07 ENCOUNTER — Ambulatory Visit (INDEPENDENT_AMBULATORY_CARE_PROVIDER_SITE_OTHER): Payer: BC Managed Care – PPO

## 2018-08-07 ENCOUNTER — Ambulatory Visit (HOSPITAL_COMMUNITY)
Admission: EM | Admit: 2018-08-07 | Discharge: 2018-08-07 | Disposition: A | Discharge status: Home / Self Care | Payer: BC Managed Care – PPO | Attending: Family Medicine | Admitting: Family Medicine

## 2018-08-07 DIAGNOSIS — M545 Low back pain, unspecified: Secondary | ICD-10-CM

## 2018-08-07 DIAGNOSIS — Z3202 Encounter for pregnancy test, result negative: Secondary | ICD-10-CM

## 2018-08-07 LAB — POCT URINALYSIS DIP (DEVICE)
Bilirubin Urine: NEGATIVE
Glucose, UA: NEGATIVE mg/dL
Ketones, ur: NEGATIVE mg/dL
Leukocytes,Ua: NEGATIVE
Nitrite: NEGATIVE
Protein, ur: NEGATIVE mg/dL
Specific Gravity, Urine: 1.01 (ref 1.005–1.030)
Urobilinogen, UA: 0.2 mg/dL (ref 0.0–1.0)
pH: 7 (ref 5.0–8.0)

## 2018-08-07 LAB — POCT PREGNANCY, URINE: Preg Test, Ur: NEGATIVE

## 2018-08-07 MED ORDER — PREDNISONE 10 MG (21) PO TBPK: ORAL_TABLET | Freq: Every day | ORAL | 0 refills | Status: AC

## 2018-08-07 MED ORDER — HYDROCODONE-ACETAMINOPHEN 5-325 MG PO TABS: 1.0000 | ORAL_TABLET | Freq: Four times a day (QID) | ORAL | 0 refills | Status: AC | PRN

## 2018-08-07 NOTE — ED Triage Notes (Signed)
Pt c/o bilaterl flank pain x1 week, did a televisit with  Her PCP who thought it might be muscle related so was given muscle relaxers and pain medicines without relief.

## 2018-08-07 NOTE — ED Provider Notes (Signed)
[facility]   [id number] 08/07/18 Arrival Time: 1143  ASSESSMENT & PLAN:  1. Acute bilateral low back pain without sciatica    Discussed low yield of plain imaging with acute back pain. She wishes to proceed. I have personally viewed the imaging studies ordered this visit. No acute findings. Discussed.  Able to ambulate here with pain; hemodynamically stable; normal neurological exam of both lower extremities. Discussed possibility of herniated disk. May benefit from orthopaedic follow up.  Trial of: Meds ordered this encounter  Medications  . predniSONE (STERAPRED UNI-PAK 21 TAB) 10 MG (21) TBPK tablet    Sig: Take by mouth daily. Take as directed.    Dispense:  21 tablet    Refill:  0  . HYDROcodone-acetaminophen (NORCO/VICODIN) 5-325 MG tablet    Sig: Take 1 tablet by mouth every 6 (six) hours as needed for moderate pain or severe pain.    Dispense:  8 tablet    Refill:  0   [place] Controlled Substances Registry consulted for this patient. I feel the risk/benefit ratio today is favorable for proceeding with this prescription for a controlled substance. Medication sedation precautions given.   Follow-up Information    Schedule an appointment as soon as possible for a visit  with Georgann HousekeeperHusain, Karrar, MD.   Specialty:  Internal Medicine Contact information: [address] [place] [address] [phone]        [facility].   Specialty:  Emergency Medicine Why:  Should your symptoms worsen. Contact information: [address] [id number] [place] [id number] [phone]         Reviewed expectations re: course of current medical issues. Questions answered. Outlined signs and symptoms indicating need for more acute intervention. Patient verbalized understanding. After Visit Summary given.   SUBJECTIVE: History from: patient.  Joyce Natterschanna E King is a 38 y.o. female who presents  with complaint of persistent bilateral lower back pain. Onset gradual, 5-6 days ago. Injury/trama: none reported. History of back problems: occasional muscle strain. Discomfort described as "like labor pain" and without radiation to her lower extremities. Pain worsens with movement/ambulation and is slightly better with rest/sitting in firm chair at home. Pain is affecting sleep. Progressive LE weakness or saddle anesthesia: none. Extremity sensation changes: none. Does reports her legs "feeling weak". Ambulatory but feels like she needs assistance to walk a distance; all secondary to pain. Normal bowel/bladder habits: yes, without urinary retention. No associated abdominal pain/n/v. Normal PO intake. No dysuria, urinary frequency, or hematuria. Reports e-visit with her PCP; Rx muscle relaxant and NSAID; little to no help.  Patient's last menstrual period was 07/30/2018.  Reports no chronic steroid use, fevers, IV drug use, or recent back surgeries or procedures.  ROS: As per HPI. All other systems negative.   OBJECTIVE:  Vitals:   08/07/18 1159 08/07/18 1200  BP: 129/75   Pulse: 63   Resp: 16   Temp:  97.6 F (36.4 C)  SpO2: 100%     General appearance: alert; no distress; sitting in wheelchair Neck: supple with FROM; without midline tenderness CV: RRR Lungs: unlabored respirations; symmetrical air entry Abdomen: soft, non-tender; non-distended Back: moderate bilateral tenderness across lower back that is poorly localized; no specific midline tenderness but reports "hard to say if it hurts there"; bruising: none Extremities: no edema; symmetrical with no gross deformities; normal ROM of bilateral lower extremities Skin: warm and dry Neurologic: able to bear weight/stand but reports "severe" back pain; brisk  normal reflexes of RLE and LLE; normal sensation of RLE and LLE; normal strength of RLE and LLE Psychological: alert and cooperative; normal mood and affect  Labs: Results for  orders placed or performed during the hospital encounter of 08/07/18  POCT urinalysis dip (device)  Result Value Ref Range   Glucose, UA NEGATIVE NEGATIVE mg/dL   Bilirubin Urine NEGATIVE NEGATIVE   Ketones, ur NEGATIVE NEGATIVE mg/dL   Specific Gravity, Urine 1.010 1.005 - 1.030   Hgb urine dipstick TRACE (A) NEGATIVE   pH 7.0 5.0 - 8.0   Protein, ur NEGATIVE NEGATIVE mg/dL   Urobilinogen, UA 0.2 0.0 - 1.0 mg/dL   Nitrite NEGATIVE NEGATIVE   Leukocytes,Ua NEGATIVE NEGATIVE  Pregnancy, urine POC  Result Value Ref Range   Preg Test, Ur NEGATIVE NEGATIVE   Labs Reviewed  POCT URINALYSIS DIP (DEVICE) - Abnormal; Notable for the following components:      Result Value   Hgb urine dipstick TRACE (*)    All other components within normal limits  POC URINE PREG, ED  POCT PREGNANCY, URINE   Imaging: Dg Lumbar Spine Complete  Result Date: 08/07/2018 CLINICAL DATA:  Worsening low back pain beginning 5 days ago. EXAM: LUMBAR SPINE - COMPLETE 4+ VIEW COMPARISON:  CT abdomen and pelvis 08/26/2017. Lumbar spine radiographs 03/06/2005. FINDINGS: There are 5 non rib-bearing lumbar type vertebrae. Vertebral alignment is normal. No fracture is identified. Mild disc space narrowing at L5-S1 is similar to the prior CT. Intervertebral disc space heights are preserved elsewhere. A moderate amount of stool is noted in the colon. IMPRESSION: No acute osseous abnormality identified. Mild L5-S1 disc degeneration. Electronically Signed   By: Sebastian Ache M.D.   On: 08/07/2018 13:10    No Known Allergies  Past Medical History:  Diagnosis Date  . Asthma   . PONV (postoperative nausea and vomiting)    Social History   Socioeconomic History  . Marital status: Married    Spouse name: Not on file  . Number of children: Not on file  . Years of education: Not on file  . Highest education level: Not on file  Occupational History  . Not on file  Social Needs  . Financial resource strain: Not on file  .  Food insecurity:    Worry: Not on file    Inability: Not on file  . Transportation needs:    Medical: Not on file    Non-medical: Not on file  Tobacco Use  . Smoking status: Never Smoker  Substance and Sexual Activity  . Alcohol use: No  . Drug use: No  . Sexual activity: Yes    Birth control/protection: None  Lifestyle  . Physical activity:    Days per week: Not on file    Minutes per session: Not on file  . Stress: Not on file  Relationships  . Social connections:    Talks on phone: Not on file    Gets together: Not on file    Attends religious service: Not on file    Active member of club or organization: Not on file    Attends meetings of clubs or organizations: Not on file    Relationship status: Not on file  . Intimate partner violence:    Fear of current or ex partner: Not on file    Emotionally abused: Not on file    Physically abused: Not on file    Forced sexual activity: Not on file  Other Topics Concern  . Not on  file  Social History Narrative  . Not on file   Family History  Problem Relation Age of Onset  . Cancer Mother   . Cancer Maternal Grandmother    Past Surgical History:  Procedure Laterality Date  . BREAST SURGERY     reduction  . LAPAROSCOPY    . POLYPECTOMY    . TUBAL LIGATION  07/20/2011   Procedure: POST PARTUM TUBAL LIGATION;  Surgeon: Leslie Andrea, MD;  Location: [facility];  Service: Gynecology;  Laterality: Bilateral;  Post partum tubal ligation bilateral     Mardella Layman, MD 08/17/18 [phone]

## 2018-08-07 NOTE — Discharge Instructions (Addendum)
Be aware, pain medications may cause drowsiness. Please do not drive, operate heavy machinery or make important decisions while on this medication, it can cloud your judgement.  

## 2019-06-02 ENCOUNTER — Other Ambulatory Visit: Payer: Self-pay

## 2019-06-02 ENCOUNTER — Ambulatory Visit: Payer: BC Managed Care – PPO | Attending: Family

## 2019-06-02 DIAGNOSIS — Z23 Encounter for immunization: Secondary | ICD-10-CM | POA: Insufficient documentation

## 2019-06-02 NOTE — Progress Notes (Signed)
   Covid-19 Vaccination Clinic  Name:  Joyce King    MRN: [id number] DOB: 22-Sep-1980  06/02/2019  Ms. Joyce King was observed post Covid-19 immunization for 15 minutes without incident. She was provided with Vaccine Information Sheet and instruction to access the V-Safe system.   Ms. Joyce King was instructed to call 911 with any severe reactions post vaccine: Marland Kitchen Difficulty breathing  . Swelling of face and throat  . A fast heartbeat  . A bad rash all over body  . Dizziness and weakness   Immunizations Administered    Name Date Dose VIS Date Route   Moderna COVID-19 Vaccine 06/02/2019  9:55 AM 0.5 mL 03/01/2019 Intramuscular   Manufacturer: Moderna   Lot: [id number]   NDC: 11552-080-22

## 2019-07-05 ENCOUNTER — Ambulatory Visit: Payer: BC Managed Care – PPO | Attending: Family

## 2019-07-05 DIAGNOSIS — Z23 Encounter for immunization: Secondary | ICD-10-CM

## 2019-07-05 NOTE — Progress Notes (Signed)
   Covid-19 Vaccination Clinic  Name:  Joyce King    MRN: [id number] DOB: 24-Dec-1980  07/05/2019  Joyce King was observed post Covid-19 immunization for 15 minutes without incident. She was provided with Vaccine Information Sheet and instruction to access the V-Safe system.   Joyce King was instructed to call 911 with any severe reactions post vaccine: Marland Kitchen Difficulty breathing  . Swelling of face and throat  . A fast heartbeat  . A bad rash all over body  . Dizziness and weakness   Immunizations Administered    Name Date Dose VIS Date Route   Moderna COVID-19 Vaccine 07/05/2019  9:43 AM 0.5 mL 03/01/2019 Intramuscular   Manufacturer: Moderna   Lot: [id number]   NDC: 06986-148-30

## 2019-09-22 ENCOUNTER — Other Ambulatory Visit: Payer: Self-pay | Admitting: Internal Medicine

## 2019-09-22 DIAGNOSIS — Z1231 Encounter for screening mammogram for malignant neoplasm of breast: Secondary | ICD-10-CM

## 2019-10-05 ENCOUNTER — Ambulatory Visit
Admission: RE | Admit: 2019-10-05 | Discharge: 2019-10-05 | Disposition: A | Discharge status: Home / Self Care | Payer: BC Managed Care – PPO | Source: Ambulatory Visit | Attending: Internal Medicine | Admitting: Internal Medicine

## 2019-10-05 ENCOUNTER — Other Ambulatory Visit: Payer: Self-pay

## 2019-10-05 DIAGNOSIS — Z1231 Encounter for screening mammogram for malignant neoplasm of breast: Secondary | ICD-10-CM

## 2019-12-06 ENCOUNTER — Emergency Department (HOSPITAL_COMMUNITY)
Admission: EM | Admit: 2019-12-06 | Discharge: 2019-12-07 | Disposition: A | Discharge status: Home / Self Care | Payer: BC Managed Care – PPO | Attending: Emergency Medicine | Admitting: Emergency Medicine

## 2019-12-06 ENCOUNTER — Emergency Department (HOSPITAL_COMMUNITY): Payer: BC Managed Care – PPO

## 2019-12-06 ENCOUNTER — Encounter (HOSPITAL_COMMUNITY): Payer: Self-pay

## 2019-12-06 ENCOUNTER — Other Ambulatory Visit: Payer: Self-pay

## 2019-12-06 DIAGNOSIS — Z5321 Procedure and treatment not carried out due to patient leaving prior to being seen by health care provider: Secondary | ICD-10-CM | POA: Insufficient documentation

## 2019-12-06 DIAGNOSIS — H538 Other visual disturbances: Secondary | ICD-10-CM | POA: Insufficient documentation

## 2019-12-06 DIAGNOSIS — G43909 Migraine, unspecified, not intractable, without status migrainosus: Secondary | ICD-10-CM | POA: Diagnosis present

## 2019-12-06 DIAGNOSIS — R200 Anesthesia of skin: Secondary | ICD-10-CM | POA: Diagnosis not present

## 2019-12-06 LAB — I-STAT BETA HCG BLOOD, ED (MC, WL, AP ONLY): I-stat hCG, quantitative: <5 m[IU]/mL (ref <5)

## 2019-12-06 LAB — CBC
HCT: 38.7 % (ref 36.0–46.0)
Hemoglobin: 13.5 g/dL (ref 12.0–15.0)
MCH: 26.9 pg (ref 26.0–34.0)
MCHC: 34.9 g/dL (ref 30.0–36.0)
MCV: 77.1 fL — ABNORMAL LOW (ref 80.0–100.0)
Platelets: 270 10*3/uL (ref 150–400)
RBC: 5.02 MIL/uL (ref 3.87–5.11)
RDW: 13.4 % (ref 11.5–15.5)
WBC: 7.5 10*3/uL (ref 4.0–10.5)
nRBC: 0 % (ref 0.0–0.2)

## 2019-12-06 LAB — COMPREHENSIVE METABOLIC PANEL
ALT: 59 U/L — ABNORMAL HIGH (ref 0–44)
AST: 137 U/L — ABNORMAL HIGH (ref 15–41)
Albumin: 4 g/dL (ref 3.5–5.0)
Alkaline Phosphatase: 34 U/L — ABNORMAL LOW (ref 38–126)
Anion gap: 10 (ref 5–15)
BUN: 13 mg/dL (ref 6–20)
CO2: 24 mmol/L (ref 22–32)
Calcium: 9.5 mg/dL (ref 8.9–10.3)
Chloride: 104 mmol/L (ref 98–111)
Creatinine, Ser: 0.76 mg/dL (ref 0.44–1.00)
GFR calc Af Amer: >60 mL/min (ref >60)
GFR calc non Af Amer: >60 mL/min (ref >60)
Glucose, Bld: 92 mg/dL (ref 70–99)
Potassium: 4 mmol/L (ref 3.5–5.1)
Sodium: 138 mmol/L (ref 135–145)
Total Bilirubin: 0.9 mg/dL (ref 0.3–1.2)
Total Protein: 6.9 g/dL (ref 6.5–8.1)

## 2019-12-06 LAB — DIFFERENTIAL
Abs Immature Granulocytes: 0.02 10*3/uL (ref 0.00–0.07)
Basophils Absolute: 0 10*3/uL (ref 0.0–0.1)
Basophils Relative: 1 %
Eosinophils Absolute: 0.2 10*3/uL (ref 0.0–0.5)
Eosinophils Relative: 2 %
Immature Granulocytes: 0 %
Lymphocytes Relative: 40 %
Lymphs Abs: 3 10*3/uL (ref 0.7–4.0)
Monocytes Absolute: 0.5 10*3/uL (ref 0.1–1.0)
Monocytes Relative: 7 %
Neutro Abs: 3.8 10*3/uL (ref 1.7–7.7)
Neutrophils Relative %: 50 %

## 2019-12-06 LAB — I-STAT CHEM 8, ED
BUN: 14 mg/dL (ref 6–20)
Calcium, Ion: 1.24 mmol/L (ref 1.15–1.40)
Chloride: 103 mmol/L (ref 98–111)
Creatinine, Ser: 0.8 mg/dL (ref 0.44–1.00)
Glucose, Bld: 87 mg/dL (ref 70–99)
HCT: 41 % (ref 36.0–46.0)
Hemoglobin: 13.9 g/dL (ref 12.0–15.0)
Potassium: 4 mmol/L (ref 3.5–5.1)
Sodium: 140 mmol/L (ref 135–145)
TCO2: 24 mmol/L (ref 22–32)

## 2019-12-06 LAB — PROTIME-INR
INR: 1 (ref 0.8–1.2)
Prothrombin Time: 12.9 seconds (ref 11.4–15.2)

## 2019-12-06 LAB — APTT: aPTT: 30 seconds (ref 24–36)

## 2019-12-06 NOTE — ED Provider Notes (Cosign Needed)
I was called by Dr. Isidoro Donning, hospitalist regarding this patient, who she personally knows. She states that patient called her about her symptoms today, reports headache, blurry vision and arm numbness for unknown duration or onset. She is requesting lab work and imaging. Informed her that we will triage her and order appropriate workup, if she does not meet criteria for code stroke she will need to wait for bed placement. Triage note states symptoms have been going on for 2 days. I have ordered CT of the head.  I did not evaluate this patient in triage.   Dietrich Pates, PA-C 12/06/19 2029

## 2019-12-06 NOTE — ED Triage Notes (Signed)
Pt states that she has had a headache for the past two days and numbness in both of her arms and blurry vision in both eyes. Neuro intact bilaterally.

## 2019-12-07 NOTE — ED Notes (Signed)
Pt stated that she is leaving. Pt has walked out of ED doors.

## 2020-01-02 ENCOUNTER — Other Ambulatory Visit: Payer: Self-pay | Admitting: Internal Medicine

## 2020-01-02 DIAGNOSIS — R42 Dizziness and giddiness: Secondary | ICD-10-CM

## 2020-01-02 DIAGNOSIS — R519 Headache, unspecified: Secondary | ICD-10-CM

## 2020-01-02 DIAGNOSIS — M542 Cervicalgia: Secondary | ICD-10-CM

## 2020-01-22 ENCOUNTER — Other Ambulatory Visit: Payer: Self-pay

## 2020-01-22 ENCOUNTER — Other Ambulatory Visit: Payer: BC Managed Care – PPO

## 2020-01-22 ENCOUNTER — Ambulatory Visit
Admission: RE | Admit: 2020-01-22 | Discharge: 2020-01-22 | Disposition: A | Discharge status: Home / Self Care | Payer: BC Managed Care – PPO | Source: Ambulatory Visit | Attending: Internal Medicine | Admitting: Internal Medicine

## 2020-01-22 DIAGNOSIS — R42 Dizziness and giddiness: Secondary | ICD-10-CM

## 2020-01-22 DIAGNOSIS — M542 Cervicalgia: Secondary | ICD-10-CM

## 2020-01-22 DIAGNOSIS — R519 Headache, unspecified: Secondary | ICD-10-CM

## 2020-01-22 MED ORDER — GADOBENATE DIMEGLUMINE 529 MG/ML IV SOLN
19.0000 mL | Freq: Once | INTRAVENOUS | Status: AC | PRN
  Administered 2020-01-22: 19 mL via INTRAVENOUS

## 2020-03-08 ENCOUNTER — Ambulatory Visit: Payer: BC Managed Care – PPO | Attending: Internal Medicine

## 2020-03-08 DIAGNOSIS — Z23 Encounter for immunization: Secondary | ICD-10-CM

## 2020-03-08 NOTE — Progress Notes (Signed)
   Covid-19 Vaccination Clinic  Name:  Joyce King    MRN: [id number] DOB: 11-Apr-1980  03/08/2020  [name] was observed post Covid-19 immunization for 15 minutes without incident. She was provided with Vaccine Information Sheet and instruction to access the V-Safe system.   [name] was instructed to call 911 with any severe reactions post vaccine: Marland Kitchen Difficulty breathing  . Swelling of face and throat  . A fast heartbeat  . A bad rash all over body  . Dizziness and weakness   Immunizations Administered    Name Date Dose VIS Date Route   Pfizer COVID-19 Vaccine 03/08/2020  5:05 PM 0.3 mL 01/18/2020 Intramuscular   Manufacturer: [employer]   Lot: [id number]   NDC: [address]-9

## 2020-03-26 ENCOUNTER — Other Ambulatory Visit: Payer: BC Managed Care – PPO

## 2020-03-26 DIAGNOSIS — Z20822 Contact with and (suspected) exposure to covid-19: Secondary | ICD-10-CM

## 2020-03-27 LAB — NOVEL CORONAVIRUS, NAA: SARS-CoV-2, NAA: NOT DETECTED

## 2020-03-27 LAB — SARS-COV-2, NAA 2 DAY TAT

## 2021-03-05 IMAGING — MR MR HEAD WO/W CM
12 series · 48 of 48 positions shown · IV contrast (19ml Multihance)
Comparison: CT head 12/06/2019

CLINICAL DATA: Neck pain, dizziness, intractable headache.

EXAM:
MRI HEAD WITHOUT AND WITH CONTRAST
TECHNIQUE: Multiplanar, multiecho pulse sequences of the brain and surrounding
structures were obtained without and with intravenous contrast.
CONTRAST:  19mL MULTIHANCE GADOBENATE DIMEGLUMINE 529 MG/ML IV SOLN

[Series 2: T1 · sagittal · 5.0mm · 0.45mm/px · 1 of 21 slices shown]
[im 1/21]
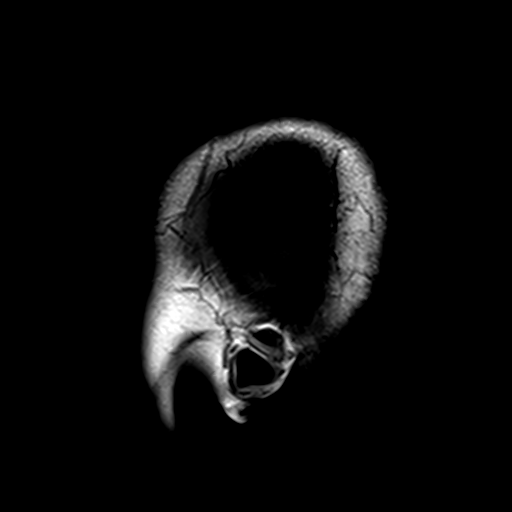

[Series 3: DWI · axial · 3.0mm · 1.80mm/px · z∈[-44,+103]mm · 6 of 100 slices shown (1 of 4)]
[im 1/100]
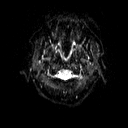
[im 20/100]
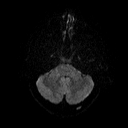
[im 40/100]
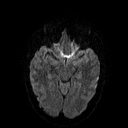
[im 60/100]
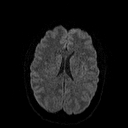
[im 80/100]
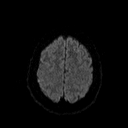
[im 100/100]
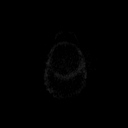

[Series 4: DWI · axial · 3.0mm · 1.80mm/px · z∈[-44,+103]mm · 3 of 48 slices shown (2 of 4)]
[im 1/48]
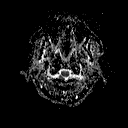
[im 24/48]
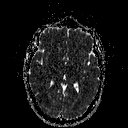
[im 48/48]
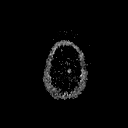

[Series 5: DWI · coronal · 5.0mm · 1.80mm/px · 5 of 68 slices shown (3 of 4)]
[im 1/68]
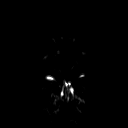
[im 17/68]
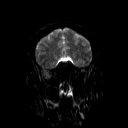
[im 34/68]
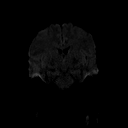
[im 51/68]
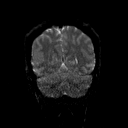
[im 68/68]
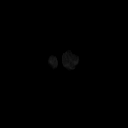

[Series 6: DWI · coronal · 5.0mm · 1.80mm/px · 2 of 34 slices shown (4 of 4)]
[im 1/34]
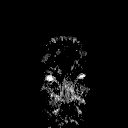
[im 34/34]
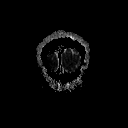

[Series 7: T2 · axial · 5.0mm · 0.72mm/px · z∈[-48,+106]mm · 2 of 24 slices shown (1 of 2)]
[im 1/24]
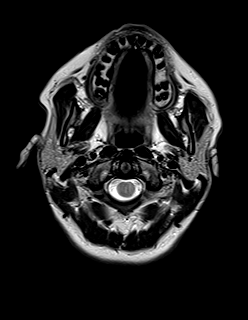
[im 24/24]
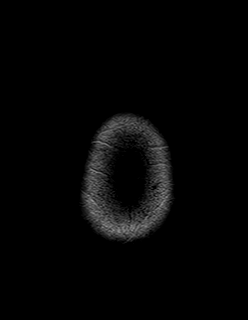

[Series 8: FLAIR · axial · 3.0mm · 0.45mm/px · z∈[-51,+107]mm · 2 of 35 slices shown]
[im 1/35]
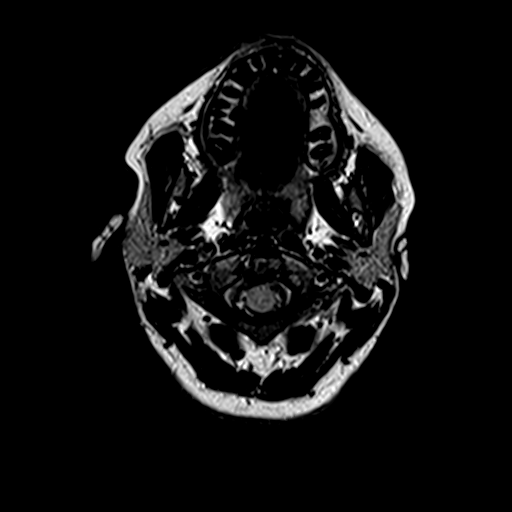
[im 35/35]
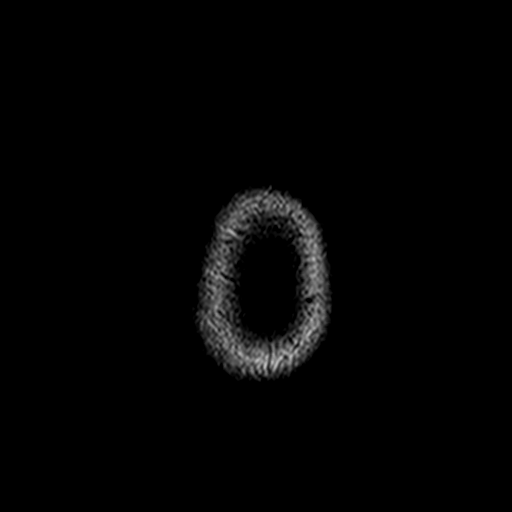

[Series 10: swi_images · axial · 4.0mm · 0.90mm/px · z∈[-49,+106]mm · 3 of 40 slices shown]
[im 1/40]
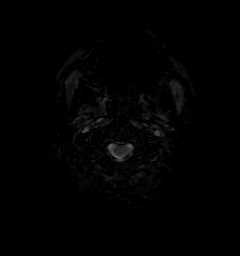
[im 20/40]
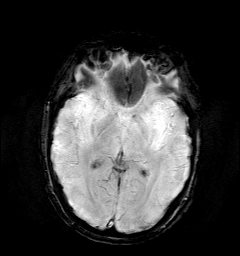
[im 40/40]
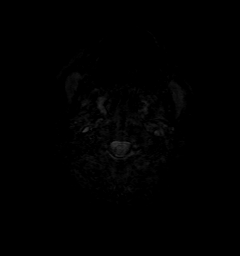

[Series 11: t1_mpr_tra · axial · 1.0mm · 0.75mm/px · z∈[-46,+96]mm · 10 of 144 slices shown (1 of 2)]
[im 1/144]
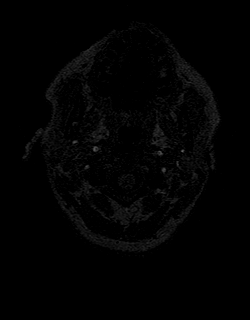
[im 16/144]
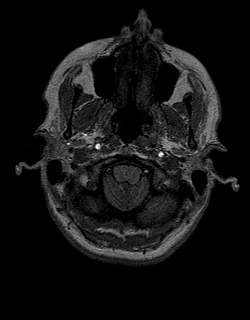
[im 32/144]
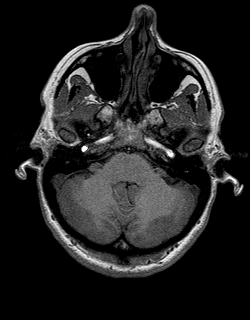
[im 48/144]
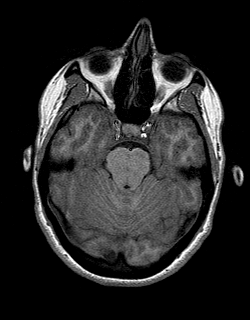
[im 64/144]
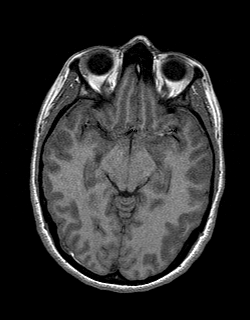
[im 80/144]
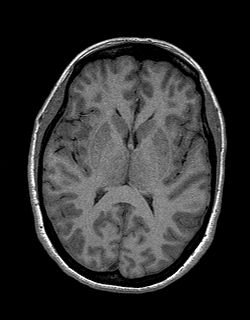
[im 96/144]
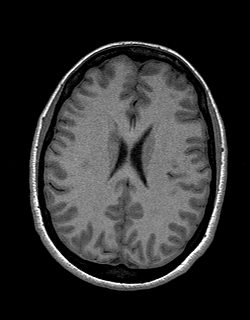
[im 112/144]
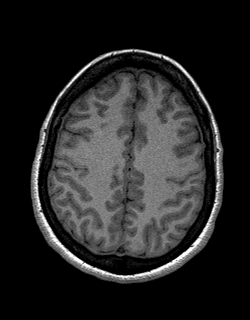
[im 128/144]
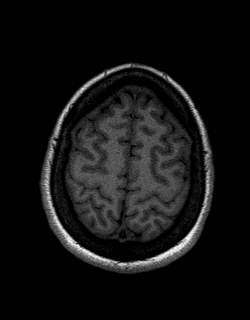
[im 144/144]
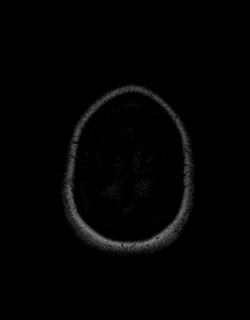

[Series 12: T2 · coronal · 5.0mm · 0.45mm/px · 2 of 25 slices shown (2 of 2)]
[im 1/25]
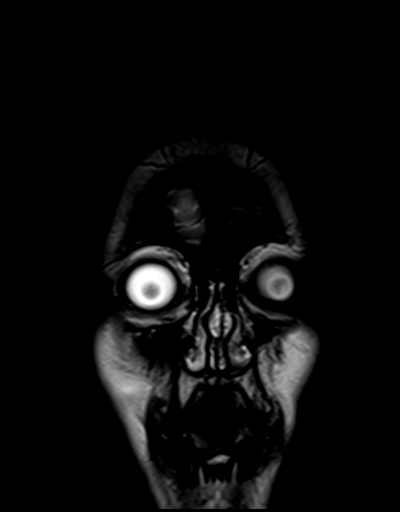
[im 25/25]
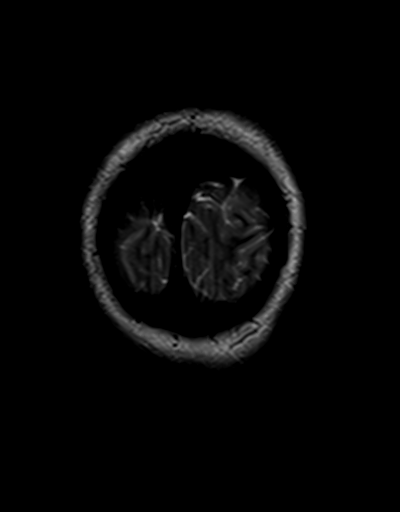

[Series 13: t1_mpr_tra · axial · 1.0mm · 0.75mm/px · z∈[-46,+96]mm · 10 of 144 slices shown (2 of 2)]
[im 1/144]
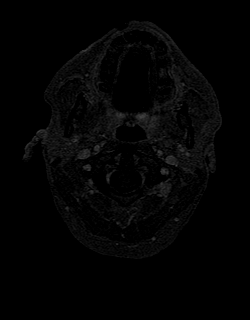
[im 16/144]
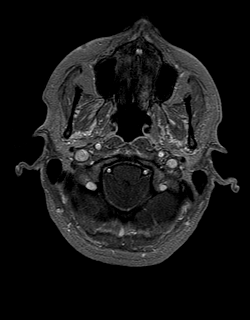
[im 32/144]
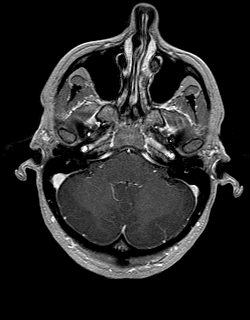
[im 48/144]
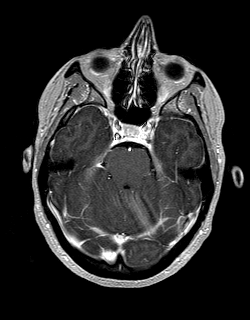
[im 64/144]
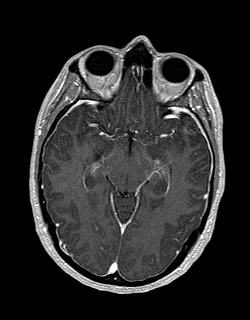
[im 80/144]
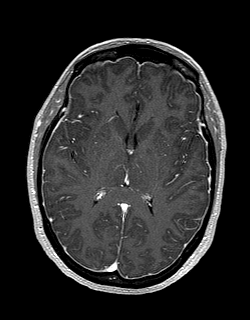
[im 96/144]
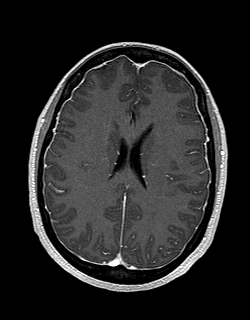
[im 112/144]
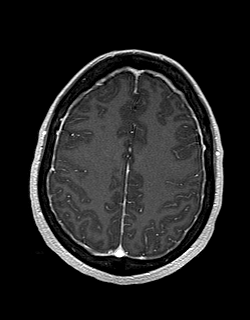
[im 128/144]
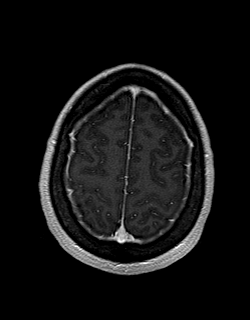
[im 144/144]
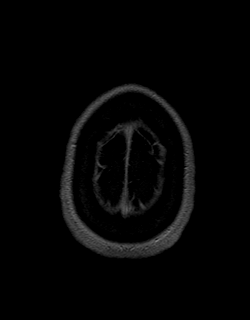

[Series 14: post cor · coronal · 5.0mm · 0.45mm/px · 2 of 25 slices shown]
[im 1/25]
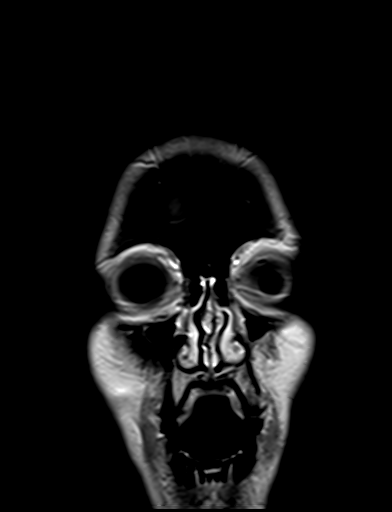
[im 25/25]
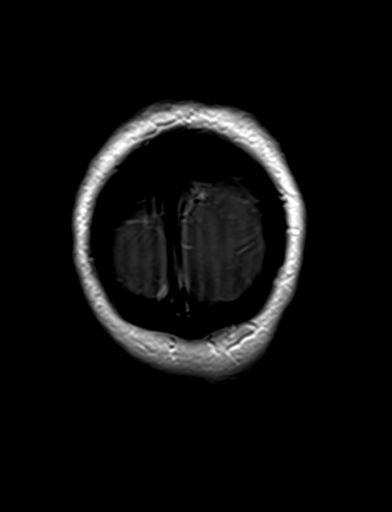

[48 of 48 positions shown; findings below may reference images not displayed]

FINDINGS: Brain: There is approximately 10 mm of inferior cerebellar tonsillar
descent at the foramen magnum. Additionally, there is narrowing of
the mamillopontine distance and diffuse dural
thickening/enhancement. No subdural fluid collections. No acute
infarct. No hydrocephalus. No mass lesion.

Vascular: Major arterial flow voids are maintained at the skull
base.

Skull and upper cervical spine: Normal marrow signal.

Sinuses/Orbits: Negative.

Other: No mastoid effusions.
IMPRESSION: Approximately 10 mm of inferior cerebellar tonsillar descent at the
foramen magnum. Additionally, there is narrowing of the
mamillopontine distance and diffuse dural thickening/enhancement.
Constellation of findings is highly suggestive of intracranial
hypotension. An MRI of the spine and/or CT myelography could
evaluate for potential site of CSF leak.

These results will be called to the ordering clinician or
representative by the Radiologist Assistant, and communication
documented in the PACS or [REDACTED].

## 2023-06-17 ENCOUNTER — Other Ambulatory Visit: Payer: Self-pay | Admitting: Internal Medicine

## 2023-06-17 DIAGNOSIS — Z1231 Encounter for screening mammogram for malignant neoplasm of breast: Secondary | ICD-10-CM
